# Patient Record
Sex: Female | Born: 1965 | Race: White | Hispanic: No | Smoking: Current every day smoker
Health system: Southern US, Community
[De-identification: ages and names within clinical notes are randomized; demographics above are authoritative.]

## PROBLEM LIST (undated history)

## (undated) ENCOUNTER — Emergency Department: Discharge status: Absent without leave | Payer: Medicaid Other

## (undated) DIAGNOSIS — M545 Low back pain, unspecified: Secondary | ICD-10-CM

## (undated) DIAGNOSIS — E559 Vitamin D deficiency, unspecified: Secondary | ICD-10-CM

## (undated) DIAGNOSIS — E7800 Pure hypercholesterolemia, unspecified: Secondary | ICD-10-CM

## (undated) DIAGNOSIS — I10 Essential (primary) hypertension: Secondary | ICD-10-CM

## (undated) DIAGNOSIS — K219 Gastro-esophageal reflux disease without esophagitis: Secondary | ICD-10-CM

## (undated) DIAGNOSIS — I219 Acute myocardial infarction, unspecified: Secondary | ICD-10-CM

## (undated) DIAGNOSIS — R232 Flushing: Secondary | ICD-10-CM

## (undated) DIAGNOSIS — N200 Calculus of kidney: Secondary | ICD-10-CM

## (undated) DIAGNOSIS — R9431 Abnormal electrocardiogram [ECG] [EKG]: Secondary | ICD-10-CM

## (undated) DIAGNOSIS — M069 Rheumatoid arthritis, unspecified: Secondary | ICD-10-CM

## (undated) HISTORY — PX: CARDIAC SURGERY: SHX584

## (undated) HISTORY — DX: Calculus of kidney: N20.0

## (undated) HISTORY — DX: Flushing: R23.2

## (undated) HISTORY — DX: Abnormal electrocardiogram (ECG) (EKG): R94.31

## (undated) HISTORY — DX: Low back pain, unspecified: M54.50

## (undated) HISTORY — DX: Low back pain: M54.5

## (undated) HISTORY — DX: Rheumatoid arthritis, unspecified: M06.9

## (undated) HISTORY — PX: TONSILLECTOMY: SUR1361

## (undated) HISTORY — PX: TUBAL LIGATION: SHX77

---

## 2005-05-22 ENCOUNTER — Emergency Department: Payer: Self-pay | Admitting: General Practice

## 2005-10-23 ENCOUNTER — Emergency Department: Payer: Self-pay | Admitting: Emergency Medicine

## 2005-10-23 ENCOUNTER — Other Ambulatory Visit: Payer: Self-pay

## 2006-03-25 ENCOUNTER — Emergency Department: Payer: Self-pay | Admitting: Emergency Medicine

## 2006-06-18 ENCOUNTER — Emergency Department: Payer: Self-pay | Admitting: General Practice

## 2006-12-05 ENCOUNTER — Emergency Department: Payer: Self-pay | Admitting: Emergency Medicine

## 2006-12-31 ENCOUNTER — Emergency Department: Payer: Self-pay | Admitting: Emergency Medicine

## 2007-01-07 ENCOUNTER — Emergency Department: Payer: Self-pay | Admitting: Emergency Medicine

## 2008-12-08 ENCOUNTER — Ambulatory Visit: Payer: Self-pay | Admitting: Family Medicine

## 2009-01-25 ENCOUNTER — Emergency Department: Payer: Self-pay | Admitting: Emergency Medicine

## 2009-01-31 ENCOUNTER — Emergency Department: Payer: Self-pay | Admitting: Unknown Physician Specialty

## 2009-08-17 ENCOUNTER — Emergency Department: Payer: Self-pay | Admitting: Emergency Medicine

## 2009-09-27 ENCOUNTER — Emergency Department: Payer: Self-pay | Admitting: Emergency Medicine

## 2009-11-16 ENCOUNTER — Ambulatory Visit: Payer: Self-pay | Admitting: Nurse Practitioner

## 2010-01-26 ENCOUNTER — Inpatient Hospital Stay: Payer: Self-pay | Admitting: Internal Medicine

## 2010-02-21 ENCOUNTER — Emergency Department: Payer: Self-pay | Admitting: Emergency Medicine

## 2010-11-29 ENCOUNTER — Emergency Department: Payer: Self-pay | Admitting: Emergency Medicine

## 2011-07-10 ENCOUNTER — Emergency Department: Payer: Self-pay | Admitting: *Deleted

## 2011-07-10 LAB — COMPREHENSIVE METABOLIC PANEL
Alkaline Phosphatase: 111 U/L (ref 50–136)
Anion Gap: 10 (ref 7–16)
BUN: 12 mg/dL (ref 7–18)
Bilirubin,Total: 0.4 mg/dL (ref 0.2–1.0)
Calcium, Total: 9 mg/dL (ref 8.5–10.1)
Co2: 24 mmol/L (ref 21–32)
Creatinine: 0.66 mg/dL (ref 0.60–1.30)
Potassium: 3.9 mmol/L (ref 3.5–5.1)
SGOT(AST): 64 U/L — ABNORMAL HIGH (ref 15–37)
SGPT (ALT): 86 U/L — ABNORMAL HIGH
Sodium: 140 mmol/L (ref 136–145)
Total Protein: 7.6 g/dL (ref 6.4–8.2)

## 2011-07-10 LAB — CBC
HCT: 41.1 % (ref 35.0–47.0)
HGB: 13.6 g/dL (ref 12.0–16.0)
MCH: 31.1 pg (ref 26.0–34.0)
Platelet: 220 10*3/uL (ref 150–440)
RBC: 4.36 10*6/uL (ref 3.80–5.20)
RDW: 12.3 % (ref 11.5–14.5)
WBC: 9.6 10*3/uL (ref 3.6–11.0)

## 2011-08-26 ENCOUNTER — Emergency Department: Payer: Self-pay | Admitting: *Deleted

## 2011-12-05 DIAGNOSIS — M7989 Other specified soft tissue disorders: Secondary | ICD-10-CM | POA: Insufficient documentation

## 2012-02-17 ENCOUNTER — Emergency Department: Payer: Self-pay | Admitting: Emergency Medicine

## 2012-11-17 ENCOUNTER — Emergency Department: Payer: Self-pay | Admitting: Emergency Medicine

## 2012-11-17 LAB — CBC
HCT: 44.8 % (ref 35.0–47.0)
HGB: 15.5 g/dL (ref 12.0–16.0)
MCH: 31.9 pg (ref 26.0–34.0)
MCHC: 34.6 g/dL (ref 32.0–36.0)
MCV: 92 fL (ref 80–100)
RBC: 4.86 10*6/uL (ref 3.80–5.20)
WBC: 11.9 10*3/uL — ABNORMAL HIGH (ref 3.6–11.0)

## 2012-11-17 LAB — COMPREHENSIVE METABOLIC PANEL
Albumin: 3.8 g/dL (ref 3.4–5.0)
Alkaline Phosphatase: 114 U/L (ref 50–136)
BUN: 19 mg/dL — ABNORMAL HIGH (ref 7–18)
Bilirubin,Total: 0.3 mg/dL (ref 0.2–1.0)
Co2: 28 mmol/L (ref 21–32)
EGFR (Non-African Amer.): >60
Glucose: 112 mg/dL — ABNORMAL HIGH (ref 65–99)
SGOT(AST): 25 U/L (ref 15–37)
SGPT (ALT): 41 U/L (ref 12–78)
Sodium: 139 mmol/L (ref 136–145)
Total Protein: 7.7 g/dL (ref 6.4–8.2)

## 2012-11-17 LAB — URINALYSIS, COMPLETE
Bacteria: NONE SEEN
Ketone: NEGATIVE
Leukocyte Esterase: NEGATIVE
Nitrite: NEGATIVE
Ph: 5 (ref 4.5–8.0)
Protein: NEGATIVE
RBC,UR: 1 /HPF (ref 0–5)
Specific Gravity: 1.026 (ref 1.003–1.030)
Squamous Epithelial: 1

## 2013-04-09 ENCOUNTER — Emergency Department: Payer: Self-pay | Admitting: Emergency Medicine

## 2013-06-25 LAB — BASIC METABOLIC PANEL
Anion Gap: 8 (ref 7–16)
BUN: 14 mg/dL (ref 7–18)
CALCIUM: 9.2 mg/dL (ref 8.5–10.1)
Chloride: 106 mmol/L (ref 98–107)
Co2: 26 mmol/L (ref 21–32)
Creatinine: 0.49 mg/dL — ABNORMAL LOW (ref 0.60–1.30)
EGFR (African American): >60
EGFR (Non-African Amer.): >60
GLUCOSE: 132 mg/dL — AB (ref 65–99)
Osmolality: 282 (ref 275–301)
Potassium: 3.5 mmol/L (ref 3.5–5.1)
Sodium: 140 mmol/L (ref 136–145)

## 2013-06-25 LAB — CBC
HCT: 44.3 % (ref 35.0–47.0)
HGB: 15.2 g/dL (ref 12.0–16.0)
MCH: 32 pg (ref 26.0–34.0)
MCHC: 34.3 g/dL (ref 32.0–36.0)
MCV: 93 fL (ref 80–100)
Platelet: 250 10*3/uL (ref 150–440)
RBC: 4.74 10*6/uL (ref 3.80–5.20)
RDW: 12.9 % (ref 11.5–14.5)
WBC: 11.5 10*3/uL — AB (ref 3.6–11.0)

## 2013-06-25 LAB — TROPONIN I: Troponin-I: <0.02 ng/mL

## 2013-06-25 LAB — PRO B NATRIURETIC PEPTIDE: B-Type Natriuretic Peptide: 9 pg/mL (ref 0–125)

## 2013-06-26 ENCOUNTER — Observation Stay: Payer: Self-pay | Admitting: Internal Medicine

## 2013-06-26 LAB — CK-MB: CK-MB: 0.5 ng/mL (ref 0.5–3.6)

## 2013-06-26 LAB — TROPONIN I
Troponin-I: <0.02 ng/mL
Troponin-I: <0.02 ng/mL

## 2013-06-26 LAB — CK
CK, Total: 64 U/L
CK, Total: 58 U/L

## 2013-12-08 ENCOUNTER — Emergency Department: Payer: Self-pay | Admitting: Emergency Medicine

## 2013-12-25 ENCOUNTER — Emergency Department: Payer: Self-pay | Admitting: Emergency Medicine

## 2013-12-25 LAB — URINALYSIS, COMPLETE
Bacteria: NONE SEEN
Bilirubin,UR: NEGATIVE
Blood: NEGATIVE
GLUCOSE, UR: NEGATIVE mg/dL (ref 0–75)
Hyaline Cast: 2
Ketone: NEGATIVE
LEUKOCYTE ESTERASE: NEGATIVE
Nitrite: NEGATIVE
PH: 5 (ref 4.5–8.0)
Protein: NEGATIVE
RBC,UR: 2 /HPF (ref 0–5)
Specific Gravity: 1.03 (ref 1.003–1.030)
Squamous Epithelial: 1

## 2013-12-25 LAB — COMPREHENSIVE METABOLIC PANEL
ALK PHOS: 93 U/L
ANION GAP: 9 (ref 7–16)
AST: 29 U/L (ref 15–37)
Albumin: 3.8 g/dL (ref 3.4–5.0)
BILIRUBIN TOTAL: 0.3 mg/dL (ref 0.2–1.0)
BUN: 15 mg/dL (ref 7–18)
CALCIUM: 9.5 mg/dL (ref 8.5–10.1)
Chloride: 105 mmol/L (ref 98–107)
Co2: 27 mmol/L (ref 21–32)
Creatinine: 0.57 mg/dL — ABNORMAL LOW (ref 0.60–1.30)
EGFR (African American): >60
EGFR (Non-African Amer.): >60
Glucose: 141 mg/dL — ABNORMAL HIGH (ref 65–99)
Osmolality: 284 (ref 275–301)
POTASSIUM: 4.1 mmol/L (ref 3.5–5.1)
SGPT (ALT): 34 U/L
Sodium: 141 mmol/L (ref 136–145)
Total Protein: 7.9 g/dL (ref 6.4–8.2)

## 2013-12-25 LAB — CBC WITH DIFFERENTIAL/PLATELET
Basophil #: 0.2 10*3/uL — ABNORMAL HIGH (ref 0.0–0.1)
Basophil %: 1.8 %
Eosinophil #: 0.2 10*3/uL (ref 0.0–0.7)
Eosinophil %: 2.2 %
HCT: 44.4 % (ref 35.0–47.0)
HGB: 15.3 g/dL (ref 12.0–16.0)
LYMPHS PCT: 37 %
Lymphocyte #: 3.4 10*3/uL (ref 1.0–3.6)
MCH: 32.7 pg (ref 26.0–34.0)
MCHC: 34.4 g/dL (ref 32.0–36.0)
MCV: 95 fL (ref 80–100)
MONO ABS: 0.6 x10 3/mm (ref 0.2–0.9)
Monocyte %: 6.4 %
Neutrophil #: 4.8 10*3/uL (ref 1.4–6.5)
Neutrophil %: 52.6 %
Platelet: 268 10*3/uL (ref 150–440)
RBC: 4.67 10*6/uL (ref 3.80–5.20)
RDW: 13.2 % (ref 11.5–14.5)
WBC: 9.1 10*3/uL (ref 3.6–11.0)

## 2013-12-25 LAB — TROPONIN I: Troponin-I: <0.02 ng/mL

## 2013-12-25 LAB — LIPASE, BLOOD: Lipase: 127 U/L (ref 73–393)

## 2014-02-04 ENCOUNTER — Emergency Department: Payer: Self-pay | Admitting: Student

## 2014-03-18 ENCOUNTER — Ambulatory Visit: Payer: Self-pay | Admitting: Family Medicine

## 2014-04-10 ENCOUNTER — Ambulatory Visit: Payer: Self-pay | Admitting: Specialist

## 2014-05-13 DIAGNOSIS — Z1211 Encounter for screening for malignant neoplasm of colon: Secondary | ICD-10-CM | POA: Insufficient documentation

## 2014-05-13 DIAGNOSIS — M25569 Pain in unspecified knee: Secondary | ICD-10-CM | POA: Insufficient documentation

## 2014-05-14 ENCOUNTER — Ambulatory Visit: Payer: Self-pay | Admitting: Emergency Medicine

## 2014-05-19 ENCOUNTER — Ambulatory Visit: Payer: Self-pay | Admitting: Emergency Medicine

## 2014-05-20 ENCOUNTER — Ambulatory Visit
Admit: 2014-05-20 | Disposition: A | Discharge status: Home / Self Care | Payer: Self-pay | Attending: Family Medicine | Admitting: Family Medicine

## 2014-05-20 LAB — RAPID INFLUENZA A&B ANTIGENS

## 2014-05-21 ENCOUNTER — Ambulatory Visit
Admit: 2014-05-21 | Disposition: A | Discharge status: Home / Self Care | Payer: Self-pay | Attending: Emergency Medicine | Admitting: Emergency Medicine

## 2014-05-22 ENCOUNTER — Ambulatory Visit
Admit: 2014-05-22 | Disposition: A | Discharge status: Home / Self Care | Payer: Self-pay | Attending: Family Medicine | Admitting: Family Medicine

## 2014-06-13 NOTE — Discharge Summary (Signed)
PATIENT NAME:  Nicole Ayala, HANLINE MR#:  [id number] DATE OF BIRTH:  11-19-1965  DATE OF ADMISSION:  06/26/2013 DATE OF DISCHARGE:  06/26/2013  DISCHARGE DIAGNOSES:  1. Chest pain likely noncardiac with negative serial cardiac enzymes, negative Myoview, could be stress related.  2. Tobacco abuse, counseled for 10 minutes. Agreeable to quit. Prescribed nicotine patch as per request.   SECONDARY DIAGNOSES:  1. Hypertension.  2. Anxiety.  3. Hyperlipidemia.  4. Gastroesophageal reflux disease.  5. Depression.   CONSULTATIONS: None.   PROCEDURES AND RADIOLOGY: Chest x-ray on May 6th showed no acute cardiopulmonary disease.   Myoview on May 7th showed no significant wall motion abnormality.  EF of 65%.  A 2-D echocardiogram on May 7th showed EF of more than 75%. Impaired relaxation pattern of LV diastolic filling.   HISTORY AND SHORT HOSPITAL COURSE: The patient is a 49 year old female with the above-mentioned medical problems who was admitted for chest pain. Please see Dr. Deanna Artis Gouru's dictated history and physical for further details. The patient was ruled out with 3 negative sets of troponin. She also underwent Myoview, which was negative. Also, echocardiogram which was within normal limits. Her chest pain was resolved and was discharged home in stable condition on May 7th. On the date of discharge, her vital signs are as follows: Temperature 97.7, heart rate 66 per minute, respirations 16 per minute, blood pressure 138/87 mm hg,  saturating 97% on room air.    PERTINENT PHYSICAL EXAMINATION ON THE DATE OF DISCHARGE: CARDIOVASCULAR: S1, S2 normal. No murmurs, rubs, or gallops.  LUNGS: Clear to auscultation bilaterally. No wheezing, rales, rhonchi, or crepitation.  ABDOMEN: Soft, benign.  NEUROLOGIC: Nonfocal examination. All other physical examination remained at baseline.   DISCHARGE MEDICATIONS:  1. Clonazepam 1 mg p.o. b.i.d.  2. Coreg 12.5 mg p.o. b.i.d.  3. HCTZ lisinopril 12.5/10 mg 1  tablet p.o. daily.  4. Atorvastatin 80 mg p.o. at bedtime.  5. Habitrol 21 mg patch transdermal daily.   DISCHARGE DIET: Low sodium, low fat, low cholesterol.   DISCHARGE ACTIVITY: As tolerated.   DISCHARGE INSTRUCTIONS AND FOLLOWUP: The patient was instructed to follow up with her primary care physician, Dr. Darreld Ayala, in 1-2 weeks.   TOTAL TIME DISCHARGING THIS PATIENT: 45 minutes.   ____________________________ Ellamae Sia. Sherryll Burger, MD vss:dd D: 06/29/2013 17:55:49 ET T: 06/30/2013 05:26:34 ET JOB#: [id number]  cc: Jahne Krukowski S. Sherryll Burger, MD, <Dictator> Leanna Sato, MD Ellamae Sia [facility] MD ELECTRONICALLY SIGNED 06/30/2013 10:55

## 2014-06-13 NOTE — H&P (Signed)
PATIENT NAME:  Nicole Ayala, KELTNER MR#:  [id number] DATE OF BIRTH:  February 12, 1966  DATE OF ADMISSION:  06/26/2013  PRIMARY CARE PHYSICIAN: Dr. Darreld Mclean  REFERRING PHYSICIAN:  Dr. Darnelle Catalan   CHIEF COMPLAINT: Chest pain.   HISTORY OF PRESENT ILLNESS: The patient is a 49 year old female with a past medical history of hypertension, anxiety, nicotine dependence, hyperlipidemia, GERD, and depression is presenting to the ER with a chief complaint of intermittent episodes of chest pain for the past week. The patient is reporting that she was having left-sided chest pain associated with nausea and shortness of breath. Denies any dizziness or loss of consciousness.  The chest pain is not radiating anywhere. As she was having intermittent episodes of chest pain, which is not going away, she went to see her primary care physician, Dr. Darreld Mclean, who started her on statin and anxiety medicine, Celexa.  Though patient is taking this medications, she is still having intermittent episodes of chest pressure which made her come to the hospital. Last night, her chest pain was associated with shortness of breath. The patient did not try any over-the-counter medications or pain medications. She still continues to smoke 1 pack a day. The patient was admitted to the hospital, [facility] in December 2011 with similar complaint of chest pain and had Lexiscan stress test done, which was normal at that time. The patient denies having any repeat stress test done in the past after 2011. No other complaints. During my examination, her chest pain is resolved and resting comfortably. No family members at bedside.  Denies any nausea, vomiting, abdominal pain. Denies any diarrhea, constipation. No other complaints.   PAST MEDICAL HISTORY: Hypertension, anxiety, noncompliance with medications, nicotine dependence, hyperlipidemia, GERD, depression.   PAST SURGICAL HISTORY: Tubal ligation, tonsillectomy,  adenoidectomy.  ALLERGIES: CODEINE GIVES HER RASH.   PSYCHOSOCIAL HISTORY:  Lives with grandson.  Smokes 1 pack a day. Has been smoking for more than 30 years. Denies alcohol or illicit drug usage.   FAMILY HISTORY: Hypertension runs in her family. Mom has history of congestive heart failure.   HOME MEDICATIONS: Hydrochlorothiazide lisinopril 12.5/10 1 tablet p.o. once daily, clonazepam 1 mg p.o. 2 times a day, Celexa dose unknown, Coreg 12.5 mg 2 times a day, atorvastatin 80 mg once daily, Tessalon Perles 1-2 every  8 hours as needed for cough.   REVIEW OF SYSTEMS:  CONSTITUTIONAL: Denies any fever or fatigue. Denies any weight loss or weight gain.  EYES: Denies any blurry vision, double vision, glaucoma, cataracts.  EARS, NOSE, AND THROAT: Denies epistaxis, discharge, tinnitus, snoring. RESPIRATORY:  Denies cough, COPD.  CARDIOVASCULAR: Denies any chest pain during my examination. No palpitations, syncope.  GASTROINTESTINAL: Denies nausea, vomiting, diarrhea, abdominal pain.  GENITOURINARY: No dysuria or hematuria.  GYNECOLOGIC AND BREASTS: Denies breast mass or vaginal discharge.  ENDOCRINE: Denies polyuria, nocturia, thyroid problems. HEMATOLOGY:  Denies any easy bruising, bleeding.  INTEGUMENTARY: No acne, rash, lesions.  MUSCULOSKELETAL: No joint pain in the neck and back. Denies any shoulder pain. Denies gout.  NEUROLOGIC: Denies vertigo, ataxia.  PSYCHIATRIC: Has anxiety, depression. Denies insomnia, ADD, OCD.   PHYSICAL EXAMINATION:  VITAL SIGNS: Temperature 97.5, pulse 70, respirations 16, blood pressure 126/74, pulse oximetry 96% on room air.  GENERAL APPEARANCE: Not in acute distress. Moderately built and nourished. HEENT: Normocephalic, atraumatic. Pupils are equally reacting to light and accommodation. No scleral icterus. No conjunctival injection. No sinus tenderness. No postnasal drip. Moist mucous membranes.  NECK: Supple. No JVD. No thyromegaly.  Range of motion is  intact.  LUNGS: Clear to auscultation bilaterally. No accessory muscle use and no anterior chest wall tenderness on palpation.  CARDIAC: S1, S2 normal. Regular rate and rhythm.  No murmurs. GASTROINTESTINAL: Soft.  The bowel sounds are positive in all 4 quadrants. Nontender, nondistended. No hepatosplenomegaly. No masses. NEUROLOGICAL:  Awake, alert, oriented x 3. Cranial nerves II through XII are grossly intact. Motor and sensory are intact. Reflexes are 2+.  EXTREMITIES: No edema. No cyanosis. No clubbing.  SKIN: Warm to touch. Normal turgor. No rashes. No lesions.  MUSCULOSKELETAL: No joint effusion, tenderness, erythema.  PSYCHIATRIC: Normal mood and affect.  LABORATORY AND IMAGING STUDIES: Troponin less than 0.02. CBC: WBC is elevated at 11.5.  Rest of the CBC is normal. Chem-8: Glucose 132, BUN 14, creatinine 0.49.  Rest of the Chem-8 is normal. BMP is at 9. Chest x-ray, PA and lateral views, no active cardiopulmonary disease.  Twelve lead EKG: Normal sinus rhythm at 79 beats per minute, normal PR and QRS interval. No acute ST-T wave changes.   ASSESSMENT AND PLAN: A 48 year old female presenting to the ER with a chief complaint of 1 week history of intermittent episodes of chest pain associated with shortness of breath and nausea will be admitted with the following assessment and plan.  1. Unstable angina. Admit her to telemetry, ACS protocol with oxygen, nitroglycerin, aspirin, beta blocker and statin.  We will hold a.m. dose of beta blocker as patient is scheduled for Myoview stress test in the a.m. We will obtain 2-D echocardiogram. Myoview test in the a.m.  2. Hypertension. We will resume her home medication, hydrochlorothiazide lisinopril and also Coreg.  Up titrate her medication as needed basis. At this time, blood pressure is stable.  3. Hyperlipidemia. Continue statin.  4. Gastroesophageal reflux disease.  We will provide gastrointestinal prophylaxis.  5. Anxiety and depression. The  patient denies any suicidal or homicidal ideation at this time. We will resume her home medication, Celexa, after knowing the dose, which is unknown at this time.  6. Nicotine dependence. The patient was counseled to quit smoking. We will provide her nicotine patch after ruling out acute myocardial infarction.  7. We will provide gastrointestinal and deep vein thrombosis prophylaxis.   CODE STATUS: She is full code. Son is the medical power of attorney.   Diagnosis and plan of care was discussed in detail with the patient. She is aware of the plan.   TOTAL TIME SPENT ON ADMISSION:  50 minutes.    ____________________________ Ramonita Lab, MD ag:dd/am D: 06/26/2013 02:01:38 ET T: 06/26/2013 03:16:07 ET JOB#: [id number]  cc: Ramonita Lab, MD, <Dictator> Leanna Sato, MD  Ramonita Lab MD ELECTRONICALLY SIGNED 06/29/2013 4:37

## 2014-06-26 ENCOUNTER — Encounter: Admission: RE | Payer: Self-pay | Source: Ambulatory Visit

## 2014-06-26 ENCOUNTER — Ambulatory Visit: Admission: RE | Admit: 2014-06-26 | Payer: Self-pay | Source: Ambulatory Visit | Admitting: Gastroenterology

## 2014-06-26 SURGERY — COLONOSCOPY
Anesthesia: General

## 2014-09-12 ENCOUNTER — Encounter: Payer: Self-pay | Admitting: Emergency Medicine

## 2014-09-12 ENCOUNTER — Emergency Department
Admission: EM | Admit: 2014-09-12 | Discharge: 2014-09-12 | Disposition: A | Discharge status: Home / Self Care | Payer: Medicaid Other | Attending: Emergency Medicine | Admitting: Emergency Medicine

## 2014-09-12 DIAGNOSIS — Y9389 Activity, other specified: Secondary | ICD-10-CM | POA: Diagnosis not present

## 2014-09-12 DIAGNOSIS — I10 Essential (primary) hypertension: Secondary | ICD-10-CM | POA: Diagnosis not present

## 2014-09-12 DIAGNOSIS — Z720 Tobacco use: Secondary | ICD-10-CM | POA: Insufficient documentation

## 2014-09-12 DIAGNOSIS — Y998 Other external cause status: Secondary | ICD-10-CM | POA: Diagnosis not present

## 2014-09-12 DIAGNOSIS — L089 Local infection of the skin and subcutaneous tissue, unspecified: Secondary | ICD-10-CM | POA: Insufficient documentation

## 2014-09-12 DIAGNOSIS — S20162A Insect bite (nonvenomous) of breast, left breast, initial encounter: Secondary | ICD-10-CM | POA: Diagnosis present

## 2014-09-12 DIAGNOSIS — W57XXXA Bitten or stung by nonvenomous insect and other nonvenomous arthropods, initial encounter: Secondary | ICD-10-CM | POA: Insufficient documentation

## 2014-09-12 DIAGNOSIS — B9689 Other specified bacterial agents as the cause of diseases classified elsewhere: Secondary | ICD-10-CM

## 2014-09-12 DIAGNOSIS — Y9289 Other specified places as the place of occurrence of the external cause: Secondary | ICD-10-CM | POA: Insufficient documentation

## 2014-09-12 HISTORY — DX: Essential (primary) hypertension: I10

## 2014-09-12 HISTORY — DX: Vitamin D deficiency, unspecified: E55.9

## 2014-09-12 HISTORY — DX: Pure hypercholesterolemia, unspecified: E78.00

## 2014-09-12 MED ORDER — SULFAMETHOXAZOLE-TRIMETHOPRIM 800-160 MG PO TABS: 1.0000 | ORAL_TABLET | Freq: Two times a day (BID) | ORAL | Status: DC

## 2014-09-12 MED ORDER — SULFAMETHOXAZOLE-TRIMETHOPRIM 800-160 MG PO TABS
1.0000 | ORAL_TABLET | Freq: Once | ORAL | Status: AC
  Administered 2014-09-12: 1 via ORAL
  Filled 2014-09-12: qty 1

## 2014-09-12 MED ORDER — TRAMADOL HCL 50 MG PO TABS
50.0000 mg | ORAL_TABLET | Freq: Once | ORAL | Status: AC
Start: 2014-09-12 — End: 2014-09-12
  Administered 2014-09-12: 50 mg via ORAL
  Filled 2014-09-12: qty 1

## 2014-09-12 MED ORDER — IBUPROFEN 800 MG PO TABS
800.0000 mg | ORAL_TABLET | Freq: Once | ORAL | Status: AC
  Administered 2014-09-12: 800 mg via ORAL
  Filled 2014-09-12: qty 1

## 2014-09-12 MED ORDER — TRAMADOL HCL 50 MG PO TABS: 50.0000 mg | ORAL_TABLET | Freq: Four times a day (QID) | ORAL | Status: DC | PRN

## 2014-09-12 MED ORDER — IBUPROFEN 800 MG PO TABS: 800.0000 mg | ORAL_TABLET | Freq: Three times a day (TID) | ORAL | Status: DC | PRN

## 2014-09-12 NOTE — ED Provider Notes (Signed)
[facility] Emergency Department Provider Note  ____________________________________________  Time seen: Approximately 10:37 PM  I have reviewed the triage vital signs and the nursing notes.   HISTORY  Chief Complaint Insect Bite    HPI Nicole Ayala is a 49 y.o. female patient complain of edema and erythema to the left breast. Patient states she's been doing a lot of yard work help both in the day and received multiple chigger bites earlier this week. Patient state the left breast is now erythematous and edematous and very tender to palpation. He denies any fever with this she denies any other swelling. Patient rating the pain as 8/10. No palliative measures taken for this complaint. Patient denies any discharge from the breast or nipple area.   Past Medical History  Diagnosis Date  . Hypertension   . Hypercholesteremia   . Vitamin D deficiency     There are no active problems to display for this patient.   Past Surgical History  Procedure Laterality Date  . Tubal ligation    . Tonsillectomy      Current Outpatient Rx  Name  Route  Sig  Dispense  Refill  . ibuprofen (ADVIL,MOTRIN) 800 MG tablet   Oral   Take 1 tablet (800 mg total) by mouth every 8 (eight) hours as needed for moderate pain.   15 tablet   0   . sulfamethoxazole-trimethoprim (BACTRIM DS,SEPTRA DS) 800-160 MG per tablet   Oral   Take 1 tablet by mouth 2 (two) times daily.   20 tablet   0   . traMADol (ULTRAM) 50 MG tablet   Oral   Take 1 tablet (50 mg total) by mouth every 6 (six) hours as needed.   20 tablet   0     Allergies Review of patient's allergies indicates no known allergies.  History reviewed. No pertinent family history.  Social History History  Substance Use Topics  . Smoking status: Current Every Day Smoker  . Smokeless tobacco: Not on file  . Alcohol Use: No    Review of Systems Constitutional: No fever/chills Eyes: No visual changes. ENT: No  sore throat. Cardiovascular: Denies chest pain. Respiratory: Denies shortness of breath. Gastrointestinal: No abdominal pain.  No nausea, no vomiting.  No diarrhea.  No constipation. Genitourinary: Negative for dysuria. Musculoskeletal: Negative for back pain. Skin: Erythema and edema to the left lateral breast.   Neurological: Negative for headaches, focal weakness or numbness. Endocrine:Hypertension and hyperlipidemia. 10-point ROS otherwise negative.  ____________________________________________   PHYSICAL EXAM:  VITAL SIGNS: ED Triage Vitals  Enc Vitals Group     BP 09/12/14 2133 157/83 mmHg     Pulse Rate 09/12/14 2133 73     Resp 09/12/14 2133 20     Temp 09/12/14 2133 98.3 F (36.8 C)     Temp Source 09/12/14 2133 Oral     SpO2 09/12/14 2133 95 %     Weight 09/12/14 2133 172 lb (78.019 kg)     Height 09/12/14 2133 4\' 11"  (1.499 m)     Head Cir --      Peak Flow --      Pain Score 09/12/14 2133 8     Pain Loc --      Pain Edu? --      Excl. in GC? --     Constitutional: Alert and oriented. Well appearing and in no acute distress. Eyes: Conjunctivae are normal. PERRL. EOMI. Head: Atraumatic. Nose: No congestion/rhinnorhea. Mouth/Throat: Mucous membranes are  moist.  Oropharynx non-erythematous. Neck: No stridor.  No cervical spine tenderness to palpation. Hematological/Lymphatic/Immunilogical: No cervical lymphadenopathy. Cardiovascular: Normal rate, regular rhythm. Grossly normal heart sounds.  Good peripheral circulation. Respiratory: Normal respiratory effort.  No retractions. Lungs CTAB. Gastrointestinal: Soft and nontender. No distention. No abdominal bruits. No CVA tenderness. Musculoskeletal: No lower extremity tenderness nor edema.  No joint effusions. Neurologic:  Normal speech and language. No gross focal neurologic deficits are appreciated. No gait instability. Skin:  He edema erythema to the left lateral breast area. There is no nipple discharge. Area  is nonfluctuant. Psychiatric: Mood and affect are normal. Speech and behavior are normal.  ____________________________________________   LABS (all labs ordered are listed, but only abnormal results are displayed)  Labs Reviewed - No data to display ____________________________________________  EKG   ____________________________________________  RADIOLOGY   ____________________________________________   PROCEDURES  Procedure(s) performed: None  Critical Care performed: No  ____________________________________________   INITIAL IMPRESSION / ASSESSMENT AND PLAN / ED COURSE  Pertinent labs & imaging results that were available during my care of the patient were reviewed by me and considered in my medical decision making (see chart for details).   infection to the left breast secondary to insect bites. Patient given advice on home care. Patient given Bactrim DS, tramadol, and ibuprofen. Patient given a prescription for same medications of filling the morning. Patient advised follow-up family doctor in 2-3 days return back to ER for condition worsens. ____________________________________________   FINAL CLINICAL IMPRESSION(S) / ED DIAGNOSES  Final diagnoses:  Localized bacterial infection of skin      Joni Reining, PA-C 09/12/14 2251  Sharyn Creamer, MD 09/12/14 [phone]

## 2014-09-12 NOTE — ED Notes (Signed)
AAOx3.  Skin warm and dry.  D/c home.  NAD.

## 2014-09-12 NOTE — ED Notes (Signed)
Pt presents to ER alert and in NAd. P tstates "chigger" bites to left breast for several days.

## 2014-09-21 ENCOUNTER — Encounter: Payer: Self-pay | Admitting: Emergency Medicine

## 2014-09-21 ENCOUNTER — Emergency Department
Admission: EM | Admit: 2014-09-21 | Discharge: 2014-09-21 | Disposition: A | Discharge status: Home / Self Care | Payer: Medicaid Other | Attending: Emergency Medicine | Admitting: Emergency Medicine

## 2014-09-21 DIAGNOSIS — Z792 Long term (current) use of antibiotics: Secondary | ICD-10-CM | POA: Diagnosis not present

## 2014-09-21 DIAGNOSIS — Z720 Tobacco use: Secondary | ICD-10-CM | POA: Diagnosis not present

## 2014-09-21 DIAGNOSIS — M549 Dorsalgia, unspecified: Secondary | ICD-10-CM | POA: Diagnosis present

## 2014-09-21 DIAGNOSIS — F419 Anxiety disorder, unspecified: Secondary | ICD-10-CM | POA: Diagnosis not present

## 2014-09-21 DIAGNOSIS — Z79899 Other long term (current) drug therapy: Secondary | ICD-10-CM | POA: Insufficient documentation

## 2014-09-21 DIAGNOSIS — M5431 Sciatica, right side: Secondary | ICD-10-CM | POA: Insufficient documentation

## 2014-09-21 DIAGNOSIS — I10 Essential (primary) hypertension: Secondary | ICD-10-CM | POA: Insufficient documentation

## 2014-09-21 MED ORDER — DEXAMETHASONE SODIUM PHOSPHATE 10 MG/ML IJ SOLN
10.0000 mg | Freq: Once | INTRAMUSCULAR | Status: AC
  Administered 2014-09-21: 10 mg via INTRAMUSCULAR
  Filled 2014-09-21: qty 1

## 2014-09-21 MED ORDER — TRAMADOL HCL 50 MG PO TABS: 50.0000 mg | ORAL_TABLET | Freq: Four times a day (QID) | ORAL | Status: DC | PRN

## 2014-09-21 MED ORDER — ONDANSETRON 4 MG PO TBDP
4.0000 mg | ORAL_TABLET | Freq: Once | ORAL | Status: AC
  Administered 2014-09-21: 4 mg via ORAL
  Filled 2014-09-21: qty 1

## 2014-09-21 MED ORDER — MORPHINE SULFATE 4 MG/ML IJ SOLN
4.0000 mg | Freq: Once | INTRAMUSCULAR | Status: AC
  Administered 2014-09-21: 4 mg via INTRAMUSCULAR
  Filled 2014-09-21: qty 1

## 2014-09-21 NOTE — ED Notes (Signed)
Pt reports lower back pain that radiates down into right leg, pt denies injury.

## 2014-09-21 NOTE — Discharge Instructions (Signed)
Sciatica Sciatica is pain, weakness, numbness, or tingling along the path of the sciatic nerve. The nerve starts in the lower back and runs down the back of each leg. The nerve controls the muscles in the lower leg and in the back of the knee, while also providing sensation to the back of the thigh, lower leg, and the sole of your foot. Sciatica is a symptom of another medical condition. For instance, nerve damage or certain conditions, such as a herniated disk or bone spur on the spine, pinch or put pressure on the sciatic nerve. This causes the pain, weakness, or other sensations normally associated with sciatica. Generally, sciatica only affects one side of the body. CAUSES   Herniated or slipped disc.  Degenerative disk disease.  A pain disorder involving the narrow muscle in the buttocks (piriformis syndrome).  Pelvic injury or fracture.  Pregnancy.  Tumor (rare). SYMPTOMS  Symptoms can vary from mild to very severe. The symptoms usually travel from the low back to the buttocks and down the back of the leg. Symptoms can include:  Mild tingling or dull aches in the lower back, leg, or hip.  Numbness in the back of the calf or sole of the foot.  Burning sensations in the lower back, leg, or hip.  Sharp pains in the lower back, leg, or hip.  Leg weakness.  Severe back pain inhibiting movement. These symptoms may get worse with coughing, sneezing, laughing, or prolonged sitting or standing. Also, being overweight may worsen symptoms. DIAGNOSIS  Your caregiver will perform a physical exam to look for common symptoms of sciatica. He or she may ask you to do certain movements or activities that would trigger sciatic nerve pain. Other tests may be performed to find the cause of the sciatica. These may include:  Blood tests.  X-rays.  Imaging tests, such as an MRI or CT scan. TREATMENT  Treatment is directed at the cause of the sciatic pain. Sometimes, treatment is not necessary  and the pain and discomfort goes away on its own. If treatment is needed, your caregiver may suggest:  Over-the-counter medicines to relieve pain.  Prescription medicines, such as anti-inflammatory medicine, muscle relaxants, or narcotics.  Applying heat or ice to the painful area.  Steroid injections to lessen pain, irritation, and inflammation around the nerve.  Reducing activity during periods of pain.  Exercising and stretching to strengthen your abdomen and improve flexibility of your spine. Your caregiver may suggest losing weight if the extra weight makes the back pain worse.  Physical therapy.  Surgery to eliminate what is pressing or pinching the nerve, such as a bone spur or part of a herniated disk. HOME CARE INSTRUCTIONS   Only take over-the-counter or prescription medicines for pain or discomfort as directed by your caregiver.  Apply ice to the affected area for 20 minutes, 3-4 times a day for the first 48-72 hours. Then try heat in the same way.  Exercise, stretch, or perform your usual activities if these do not aggravate your pain.  Attend physical therapy sessions as directed by your caregiver.  Keep all follow-up appointments as directed by your caregiver.  Do not wear high heels or shoes that do not provide proper support.  Check your mattress to see if it is too soft. A firm mattress may lessen your pain and discomfort. SEEK IMMEDIATE MEDICAL CARE IF:   You lose control of your bowel or bladder (incontinence).  You have increasing weakness in the lower back, pelvis, buttocks,   or legs.  You have redness or swelling of your back.  You have a burning sensation when you urinate.  You have pain that gets worse when you lie down or awakens you at night.  Your pain is worse than you have experienced in the past.  Your pain is lasting longer than 4 weeks.  You are suddenly losing weight without reason. MAKE SURE YOU:  Understand these  instructions.  Will watch your condition.  Will get help right away if you are not doing well or get worse. Document Released: 01/31/2001 Document Revised: 08/08/2011 Document Reviewed: 06/18/2011 Exit[employer] Patient Information 2015 [employer]. This information is not intended to replace advice given to you by your health care provider. Make sure you discuss any questions you have with your health care provider.  

## 2014-09-21 NOTE — ED Notes (Signed)
Lower back pain which radiates into right leg  Ambulates with sl limp.  Denies injury

## 2014-09-21 NOTE — ED Provider Notes (Signed)
[facility] Emergency Department Provider Note  ____________________________________________  Time seen: On arrival  I have reviewed the triage vital signs and the nursing notes.   HISTORY  Chief Complaint Back Pain    HPI Nicole Ayala is a 49 y.o. female who presents with back pain. She reports the pain began yesterday and has been worsening. It is worse with flexion and extension the pain is sharp and radiates down the right leg. She denies weakness or numbness. No fevers no chills. No urinary difficulties. She is never had this before. She took Tylenol without relief. No fevers no chills  Past Medical History  Diagnosis Date  . Hypertension   . Hypercholesteremia   . Vitamin D deficiency     There are no active problems to display for this patient.   Past Surgical History  Procedure Laterality Date  . Tubal ligation    . Tonsillectomy      Current Outpatient Rx  Name  Route  Sig  Dispense  Refill  . lisinopril (PRINIVIL,ZESTRIL) 20 MG tablet   Oral   Take 20 mg by mouth daily.         . rosuvastatin (CRESTOR) 20 MG tablet   Oral   Take 20 mg by mouth daily.         Marland Kitchen ibuprofen (ADVIL,MOTRIN) 800 MG tablet   Oral   Take 1 tablet (800 mg total) by mouth every 8 (eight) hours as needed for moderate pain.   15 tablet   0   . sulfamethoxazole-trimethoprim (BACTRIM DS,SEPTRA DS) 800-160 MG per tablet   Oral   Take 1 tablet by mouth 2 (two) times daily.   20 tablet   0   . traMADol (ULTRAM) 50 MG tablet   Oral   Take 1 tablet (50 mg total) by mouth every 6 (six) hours as needed.   20 tablet   0     Allergies Review of patient's allergies indicates no known allergies.  No family history on file.  Social History History  Substance Use Topics  . Smoking status: Current Every Day Smoker    Types: Cigarettes  . Smokeless tobacco: Not on file  . Alcohol Use: No    Review of Systems  Constitutional: Negative for  fever. Eyes: Negative for visual changes. ENT: Negative for sore throat   Genitourinary: Negative for dysuria. Musculoskeletal: Negative for back pain. Skin: Negative for rash. Neurological: Negative for headaches or focal weakness   ____________________________________________   PHYSICAL EXAM:  VITAL SIGNS: ED Triage Vitals  Enc Vitals Group     BP 09/21/14 1243 101/67 mmHg     Pulse Rate 09/21/14 1243 81     Resp 09/21/14 1243 18     Temp 09/21/14 1243 97.9 F (36.6 C)     Temp Source 09/21/14 1243 Oral     SpO2 09/21/14 1243 99 %     Weight 09/21/14 1243 168 lb (76.204 kg)     Height 09/21/14 1243 4\' 11"  (1.499 m)     Head Cir --      Peak Flow --      Pain Score 09/21/14 1249 10     Pain Loc --      Pain Edu? --      Excl. in GC? --      Constitutional: Alert and oriented. No distress. Anxious and uncomfortable Eyes: Conjunctivae are normal.  ENT   Head: Normocephalic and atraumatic.   Mouth/Throat: Mucous membranes are moist.  Cardiovascular: Normal rate, regular rhythm.  Respiratory: Normal respiratory effort without tachypnea nor retractions.  Gastrointestinal: Soft and non-tender in all quadrants. No distention. There is no CVA tenderness. Musculoskeletal: Nontender with normal range of motion in all extremities. No muscle spasm felt, full range of motion of back but it painful with flexion especially. No vertebral tenderness to palpation Neurologic:  Normal speech and language. No gross focal neurologic deficits are appreciated. Strength is normal in the lower extremities Skin:  Skin is warm, dry and intact. No rash noted. Psychiatric: Mood and affect are normal. Patient exhibits appropriate insight and judgment.  ____________________________________________    LABS (pertinent positives/negatives)  Labs Reviewed - No data to display  ____________________________________________     ____________________________________________     RADIOLOGY I have personally reviewed any xrays that were ordered on this patient: None  ____________________________________________   PROCEDURES  Procedure(s) performed: none   ____________________________________________   INITIAL IMPRESSION / ASSESSMENT AND PLAN / ED COURSE  Pertinent labs & imaging results that were available during my care of the patient were reviewed by me and considered in my medical decision making (see chart for details).  Patient's history of present illness most consistent with sciatica. We will give Decadron IM and morphine and Zofran for pain control and reevaluate  On reevaluation patient's pain is significantly improved she is able to ambulate well. Recommend follow with her primary care physician. Return precautions discussed including weakness fevers chills.  ____________________________________________   FINAL CLINICAL IMPRESSION(S) / ED DIAGNOSES  Final diagnoses:  Sciatica, right     Jene Every, MD 09/21/14 1513

## 2014-10-26 ENCOUNTER — Encounter: Payer: Self-pay | Admitting: Emergency Medicine

## 2014-10-26 ENCOUNTER — Ambulatory Visit: Payer: Medicaid Other

## 2014-10-26 ENCOUNTER — Ambulatory Visit
Admission: EM | Admit: 2014-10-26 | Discharge: 2014-10-26 | Disposition: A | Discharge status: Home / Self Care | Payer: Medicaid Other | Attending: Family Medicine | Admitting: Family Medicine

## 2014-10-26 DIAGNOSIS — S6991XA Unspecified injury of right wrist, hand and finger(s), initial encounter: Secondary | ICD-10-CM | POA: Diagnosis not present

## 2014-10-26 DIAGNOSIS — E559 Vitamin D deficiency, unspecified: Secondary | ICD-10-CM | POA: Insufficient documentation

## 2014-10-26 DIAGNOSIS — E780 Pure hypercholesterolemia: Secondary | ICD-10-CM | POA: Diagnosis not present

## 2014-10-26 DIAGNOSIS — I10 Essential (primary) hypertension: Secondary | ICD-10-CM | POA: Insufficient documentation

## 2014-10-26 DIAGNOSIS — M67441 Ganglion, right hand: Secondary | ICD-10-CM | POA: Diagnosis not present

## 2014-10-26 DIAGNOSIS — F17210 Nicotine dependence, cigarettes, uncomplicated: Secondary | ICD-10-CM | POA: Insufficient documentation

## 2014-10-26 DIAGNOSIS — M6740 Ganglion, unspecified site: Secondary | ICD-10-CM | POA: Diagnosis not present

## 2014-10-26 DIAGNOSIS — X58XXXA Exposure to other specified factors, initial encounter: Secondary | ICD-10-CM | POA: Diagnosis not present

## 2014-10-26 DIAGNOSIS — Z79899 Other long term (current) drug therapy: Secondary | ICD-10-CM | POA: Diagnosis not present

## 2014-10-26 DIAGNOSIS — M79644 Pain in right finger(s): Secondary | ICD-10-CM

## 2014-10-26 MED ORDER — IBUPROFEN 600 MG PO TABS: 600.0000 mg | ORAL_TABLET | Freq: Three times a day (TID) | ORAL | Status: DC | PRN

## 2014-10-26 MED ORDER — TRAMADOL HCL 50 MG PO TABS: 50.0000 mg | ORAL_TABLET | Freq: Three times a day (TID) | ORAL | Status: DC | PRN

## 2014-10-26 NOTE — ED Notes (Signed)
Pt with pain in right 3th finger pain

## 2014-10-26 NOTE — ED Provider Notes (Signed)
[facility] Emergency Department Provider Note  ____________________________________________  Time seen: Approximately 3:17 PM  I have reviewed the triage vital signs and the nursing notes.   HISTORY  Chief Complaint Finger Injury   HPI Nicole Ayala is a 49 y.o. female presents for right middle finger pain x 4-5 days. States pain only to right middle finger middle joint. STates works with hands daily at work and frequently bumps knuckles but does not remember a specific injury. Denies redness, drainage, break in skin or other changes. Denies numbness, tingling sensations. Denies difficulty bending or moving finger. Reports pain is 6/10 aching to right middle finger, denies pain radiation. States pain worse with bending finger.    Past Medical History  Diagnosis Date  . Hypertension   . Hypercholesteremia   . Vitamin D deficiency     There are no active problems to display for this patient.   Past Surgical History  Procedure Laterality Date  . Tubal ligation    . Tonsillectomy    Pituitary gland tumor removed  Current Outpatient Rx  Name  Route  Sig  Dispense  Refill  . carvedilol (COREG CR) 10 MG 24 hr capsule   Oral   Take 25 mg by mouth daily.         Marland Kitchen ibuprofen (ADVIL,MOTRIN) 800 MG tablet   Oral   Take 1 tablet (800 mg total) by mouth every 8 (eight) hours as needed for moderate pain.   15 tablet   0   . lisinopril (PRINIVIL,ZESTRIL) 20 MG tablet   Oral   Take 20 mg by mouth daily.         . rosuvastatin (CRESTOR) 20 MG tablet   Oral   Take 20 mg by mouth daily.         Marland Kitchen sulfamethoxazole-trimethoprim (BACTRIM DS,SEPTRA DS) 800-160 MG per tablet   Oral   Take 1 tablet by mouth 2 (two) times daily.   20 tablet   0   . traMADol (ULTRAM) 50 MG tablet   Oral   Take 1 tablet (50 mg total) by mouth every 6 (six) hours as needed.   20 tablet   0    PCP: Marvis Moeller   Allergies Review of patient's allergies indicates no known  allergies.  History reviewed. No pertinent family history.  Social History Social History  Substance Use Topics  . Smoking status: Current Every Day Smoker    Types: Cigarettes  . Smokeless tobacco: None  . Alcohol Use: None    Review of Systems Constitutional: No fever/chills Eyes: No visual changes. ENT: No sore throat. Cardiovascular: Denies chest pain. Respiratory: Denies shortness of breath. Gastrointestinal: No abdominal pain.  No nausea, no vomiting.  No diarrhea.  No constipation. Genitourinary: Negative for dysuria. Musculoskeletal: Negative for back pain.right middle finger pain as above.  Skin: Negative for rash. Neurological: Negative for headaches, focal weakness or numbness.  10-point ROS otherwise negative.  ____________________________________________   PHYSICAL EXAM:  VITAL SIGNS: ED Triage Vitals  Enc Vitals Group     BP 10/26/14 1458 124/67 mmHg     Pulse Rate 10/26/14 1458 68     Resp 10/26/14 1458 18     Temp 10/26/14 1458 98.2 F (36.8 C)     Temp Source 10/26/14 1458 Tympanic     SpO2 10/26/14 1458 99 %     Weight 10/26/14 1458 170 lb (77.111 kg)     Height 10/26/14 1458 5' 0.5" (1.537 m)  Head Cir --      Peak Flow --      Pain Score 10/26/14 1502 10     Pain Loc --      Pain Edu? --      Excl. in GC? --     Constitutional: Alert and oriented. Well appearing and in no acute distress. Eyes: Conjunctivae are normal. PERRL. EOMI. Head: Atraumatic.  Nose: No congestion/rhinnorhea.  Mouth/Throat: Mucous membranes are moist.   Neck: No stridor.  No cervical spine tenderness to palpation. Hematological/Lymphatic/Immunilogical: No cervical lymphadenopathy. Cardiovascular: Normal rate, regular rhythm. Grossly normal heart sounds.  Good peripheral circulation. Respiratory: Normal respiratory effort.  No retractions. Lungs CTAB. Gastrointestinal: Soft and nontender. No distention. Normal Bowel sounds.  Musculoskeletal: No lower or upper  extremity tenderness nor edema.  No joint effusions. Bilateral pedal pulses equal and easily palpated.  Except: right middle phalanx dorsal DIP, small 1cm round firm area of swelling, illuminates with light when placed on, mild to mod TTP, Full ROM, no motor or tendon deficit, sensation intact, bilateral hand grips equal, distal radial pulses equal bilaterally. Skin intact. No erythema, no sings of infection. Right third finger otherwise nontender.  Neurologic:  Normal speech and language. No gross focal neurologic deficits are appreciated. No gait instability. Skin:  Skin is warm, dry and intact. No rash noted. Psychiatric: Mood and affect are normal. Speech and behavior are normal.  ____________________________________________   LABS (all labs ordered are listed, but only abnormal results are displayed)  Labs Reviewed - No data to display  RADIOLOGY EXAM: RIGHT MIDDLE FINGER 2+V  COMPARISON: No priors.  FINDINGS: Multiple views of the right third finger demonstrate no acute displaced fracture, subluxation, dislocation, or soft tissue abnormality.  IMPRESSION: No acute radiographic abnormality of the right middle finger.   Electronically Signed By: Trudie Reed M.D. On: 10/26/2014 15:33  I, Renford Dills, personally viewed and evaluated these images (plain radiographs) as part of my medical decision making.    ____________________________________________   PROCEDURES  Procedure(s) performed: Right third and fourth fingers buddy taped by RN. Neurovascular intact post buddy taping.  ____________________________________   INITIAL IMPRESSION / ASSESSMENT AND PLAN / ED COURSE  Pertinent labs & imaging results that were available during my care of the patient were reviewed by me and considered in my medical decision making (see chart for details).  Well appearing. No acute distress. Presents for right middle finger pain x 4-5 days with smooth round area of  swelling to right third PIP joint. Right third finger xray negative. Suspect ganglion cyst. Will treat conservatively with rest, ice, buddy tape. Follow up with Primary care physician or orthopedic, information given, as needed next week for continued pain. PRN ibuprofen and tramadol. Patient and family verbalized understanding and agreed to plan.   ____________________________________________   FINAL CLINICAL IMPRESSION(S) / ED DIAGNOSES  Final diagnoses:  Pain in finger of right hand  Ganglion cyst       Renford Dills, NP 10/26/14 1650

## 2014-10-26 NOTE — Discharge Instructions (Signed)
Take medication as prescribed. Apply ice. Rest. Avoid strenuous and repetitive motions as able.   Follow up with your primary care physician or orthopedic above as needed. Return to Urgent care for new or worsening concerns.   Ganglion Cyst A ganglion cyst is a noncancerous, fluid-filled lump that occurs near joints or tendons. The ganglion cyst grows out of a joint or the lining of a tendon. It most often develops in the hand or wrist but can also develop in the shoulder, elbow, hip, knee, ankle, or foot. The round or oval ganglion can be pea sized or larger than a grape. Increased activity may enlarge the size of the cyst because more fluid starts to build up.  CAUSES  It is not completely known what causes a ganglion cyst to grow. However, it may be related to:  Inflammation or irritation around the joint.  An injury.  Repetitive movements or overuse.  Arthritis. SYMPTOMS  A lump most often appears in the hand or wrist, but can occur in other areas of the body. Generally, the lump is painless without other symptoms. However, sometimes pain can be felt during activity or when pressure is applied to the lump. The lump may even be tender to the touch. Tingling, pain, numbness, or muscle weakness can occur if the ganglion cyst presses on a nerve. Your grip may be weak and you may have less movement in your joints.  DIAGNOSIS  Ganglion cysts are most often diagnosed based on a physical exam, noting where the cyst is and how it looks. Your caregiver will feel the lump and may shine a light alongside it. If it is a ganglion, a light often shines through it. Your caregiver may order an X-ray, ultrasound, or MRI to rule out other conditions. TREATMENT  Ganglions usually go away on their own without treatment. If pain or other symptoms are involved, treatment may be needed. Treatment is also needed if the ganglion limits your movement or if it gets infected. Treatment options include:  Wearing a  wrist or finger brace or splint.  Taking anti-inflammatory medicine.  Draining fluid from the lump with a needle (aspiration).  Injecting a steroid into the joint.  Surgery to remove the ganglion cyst and its stalk that is attached to the joint or tendon. However, ganglion cysts can grow back. HOME CARE INSTRUCTIONS   Do not press on the ganglion, poke it with a needle, or hit it with a heavy object. You may rub the lump gently and often. Sometimes fluid moves out of the cyst.  Only take medicines as directed by your caregiver.  Wear your brace or splint as directed by your caregiver. SEEK MEDICAL CARE IF:   Your ganglion becomes larger or more painful.  You have increased redness, red streaks, or swelling.  You have pus coming from the lump.  You have weakness or numbness in the affected area. MAKE SURE YOU:   Understand these instructions.  Will watch your condition.  Will get help right away if you are not doing well or get worse. Document Released: 02/04/2000 Document Revised: 11/01/2011 Document Reviewed: 04/02/2007 [facility] Patient Information 2015 [place], [place]. This information is not intended to replace advice given to you by your health care provider. Make sure you discuss any questions you have with your health care provider.  Musculoskeletal Pain Musculoskeletal pain is muscle and boney aches and pains. These pains can occur in any part of the body. Your caregiver may treat you without knowing the cause  of the pain. They may treat you if blood or urine tests, X-rays, and other tests were normal.  CAUSES There is often not a definite cause or reason for these pains. These pains may be caused by a type of germ (virus). The discomfort may also come from overuse. Overuse includes working out too hard when your body is not fit. Boney aches also come from weather changes. Bone is sensitive to atmospheric pressure changes. HOME CARE INSTRUCTIONS   Ask when your test  results will be ready. Make sure you get your test results.  Only take over-the-counter or prescription medicines for pain, discomfort, or fever as directed by your caregiver. If you were given medications for your condition, do not drive, operate machinery or power tools, or sign legal documents for 24 hours. Do not drink alcohol. Do not take sleeping pills or other medications that may interfere with treatment.  Continue all activities unless the activities cause more pain. When the pain lessens, slowly resume normal activities. Gradually increase the intensity and duration of the activities or exercise.  During periods of severe pain, bed rest may be helpful. Lay or sit in any position that is comfortable.  Putting ice on the injured area.  Put ice in a bag.  Place a towel between your skin and the bag.  Leave the ice on for 15 to 20 minutes, 3 to 4 times a day.  Follow up with your caregiver for continued problems and no reason can be found for the pain. If the pain becomes worse or does not go away, it may be necessary to repeat tests or do additional testing. Your caregiver may need to look further for a possible cause. SEEK IMMEDIATE MEDICAL CARE IF:  You have pain that is getting worse and is not relieved by medications.  You develop chest pain that is associated with shortness or breath, sweating, feeling sick to your stomach (nauseous), or throw up (vomit).  Your pain becomes localized to the abdomen.  You develop any new symptoms that seem different or that concern you. MAKE SURE YOU:   Understand these instructions.  Will watch your condition.  Will get help right away if you are not doing well or get worse. Document Released: 02/06/2005 Document Revised: 05/01/2011 Document Reviewed: 10/11/2012 [facility] Patient Information 2015 [place], [place]. This information is not intended to replace advice given to you by your health care provider. Make sure you discuss any  questions you have with your health care provider.

## 2014-11-10 ENCOUNTER — Other Ambulatory Visit
Admission: RE | Admit: 2014-11-10 | Discharge: 2014-11-10 | Disposition: A | Discharge status: Home / Self Care | Payer: Medicaid Other | Source: Ambulatory Visit | Attending: Specialist | Admitting: Specialist

## 2014-11-10 DIAGNOSIS — M130 Polyarthritis, unspecified: Secondary | ICD-10-CM | POA: Insufficient documentation

## 2014-11-10 LAB — SEDIMENTATION RATE: SED RATE: 9 mm/h (ref 0–30)

## 2014-11-11 LAB — C-REACTIVE PROTEIN: CRP: 0.8 mg/dL (ref <1.0)

## 2014-11-11 LAB — RHEUMATOID FACTOR: Rhuematoid fact SerPl-aCnc: 17.6 IU/mL — ABNORMAL HIGH (ref 0.0–13.9)
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
## 2015-02-09 ENCOUNTER — Ambulatory Visit
Admission: EM | Admit: 2015-02-09 | Discharge: 2015-02-09 | Disposition: A | Discharge status: Home / Self Care | Payer: Medicaid Other | Attending: Family Medicine | Admitting: Family Medicine

## 2015-02-09 DIAGNOSIS — L03031 Cellulitis of right toe: Secondary | ICD-10-CM | POA: Diagnosis not present

## 2015-02-09 HISTORY — DX: Gastro-esophageal reflux disease without esophagitis: K21.9

## 2015-02-09 MED ORDER — CEFUROXIME AXETIL 500 MG PO TABS: 500.0000 mg | ORAL_TABLET | Freq: Two times a day (BID) | ORAL | Status: DC

## 2015-02-09 NOTE — ED Provider Notes (Signed)
CSN: [id number]     Arrival date & time 02/09/15  0903 History   First MD Initiated Contact with Patient 02/09/15 1010     Chief Complaint  Patient presents with  . Toe Pain   (Consider location/radiation/quality/duration/timing/severity/associated sxs/prior Treatment) HPI   Nicole Ayala is a 49 y.o. female presenting for toe pain x2 days.  She first noticed pain, redness and swelling of lateral nailbed edge 2 days ago.  She has never had an ingrown toenail, toenail removal, or paronychia previously.  She denies any fevers, drainage.  Past Medical History  Diagnosis Date  . Hypertension   . Hypercholesteremia   . Vitamin D deficiency   . GERD (gastroesophageal reflux disease)    Past Surgical History  Procedure Laterality Date  . Tubal ligation    . Tonsillectomy     Family History  Problem Relation Age of Onset  . Heart failure Mother   . Cancer Father    Social History  Substance Use Topics  . Smoking status: Current Every Day Smoker -- 1.00 packs/day    Types: Cigarettes  . Smokeless tobacco: None  . Alcohol Use: No   OB History    No data available     Review of Systems  All other systems reviewed and are negative.   Allergies  Review of patient's allergies indicates no known allergies.  Home Medications   Prior to Admission medications   Medication Sig Start Date End Date Taking? Authorizing Provider  carvedilol (COREG CR) 10 MG 24 hr capsule Take 25 mg by mouth daily.   Yes Historical Provider, MD  diclofenac (VOLTAREN) 50 MG EC tablet Take 50 mg by mouth 2 (two) times daily.   Yes Historical Provider, MD  lisinopril (PRINIVIL,ZESTRIL) 20 MG tablet Take 20 mg by mouth daily.   Yes Historical Provider, MD  omeprazole (PRILOSEC) 40 MG capsule Take 40 mg by mouth daily.   Yes Historical Provider, MD  rosuvastatin (CRESTOR) 20 MG tablet Take 20 mg by mouth daily.   Yes Historical Provider, MD  cefUROXime (CEFTIN) 500 MG tablet Take 1 tablet (500 mg total) by  mouth 2 (two) times daily with a meal. 02/09/15   Erasmo Downer, MD  ibuprofen (ADVIL,MOTRIN) 600 MG tablet Take 1 tablet (600 mg total) by mouth every 8 (eight) hours as needed for mild pain or moderate pain. 10/26/14   Renford Dills, NP   Meds Ordered and Administered this Visit  Medications - No data to display  BP 174/79 mmHg  Pulse 58  Temp(Src) 97.7 F (36.5 C) (Tympanic)  Resp 16  Ht 4\' 11"  (1.499 m)  Wt 172 lb (78.019 kg)  BMI 34.72 kg/m2  SpO2 99% No data found.   Physical Exam  Constitutional: She is oriented to person, place, and time. She appears well-developed and well-nourished. No distress.  HENT:  Head: Normocephalic and atraumatic.  Eyes: Conjunctivae and EOM are normal.  Cardiovascular: Normal rate and intact distal pulses.   Pulmonary/Chest: Effort normal. No respiratory distress.  Musculoskeletal:  R great toe: Erythema and edema of lateral toenail fold with TTP. No drainage, no fluctuance. Callused area at superior aspect of erythematous are.  Toenail with onychomycosis No joint tenderness or erythema  Neurological: She is alert and oriented to person, place, and time.  Skin: Skin is warm and dry. No rash noted.  Psychiatric: She has a normal mood and affect. Her behavior is normal.    ED Course  Procedures (including critical care time)  Labs Review Labs Reviewed - No data to display  Imaging Review No results found.   Visual Acuity Review  Right Eye Distance:   Left Eye Distance:   Bilateral Distance:    Right Eye Near:   Left Eye Near:    Bilateral Near:         MDM   1. Paronychia of great toe of right foot    Treat with 10 day course of Ceftin 500mg  BID.  Advised warm compresses and tylenol prn for pain control.  Follow-up with PCP if worsening or not improving.  Also advised PCP follow-up for BP.  , MD, MPH PGY-2,  [facility] 02/09/2015 10:37 AM  02/11/2015, MD 02/09/15  [phone]

## 2015-02-09 NOTE — ED Provider Notes (Signed)
CSN: [id number]     Arrival date & time 02/09/15  2683 History   First MD Initiated Contact with Patient 02/09/15 1010    Nurses notes were reviewed. Chief Complaint  Patient presents with  . Toe Pain   (Consider location/radiation/quality/duration/timing/severity/associated sxs/prior Treatment) Patient is a 49 y.o. female presenting with toe pain. The history is provided by the patient. No language interpreter was used.  Toe Pain This is a new problem. Nothing aggravates the symptoms. Nothing relieves the symptoms. She has tried nothing for the symptoms.    Past Medical History  Diagnosis Date  . Hypertension   . Hypercholesteremia   . Vitamin D deficiency   . GERD (gastroesophageal reflux disease)    Past Surgical History  Procedure Laterality Date  . Tubal ligation    . Tonsillectomy     Family History  Problem Relation Age of Onset  . Heart failure Mother   . Cancer Father    Social History  Substance Use Topics  . Smoking status: Current Every Day Smoker -- 1.00 packs/day    Types: Cigarettes  . Smokeless tobacco: None  . Alcohol Use: No   OB History    No data available     Review of Systems  All other systems reviewed and are negative.   Allergies  Review of patient's allergies indicates no known allergies.  Home Medications   Prior to Admission medications   Medication Sig Start Date End Date Taking? Authorizing Provider  carvedilol (COREG CR) 10 MG 24 hr capsule Take 25 mg by mouth daily.   Yes Historical Provider, MD  diclofenac (VOLTAREN) 50 MG EC tablet Take 50 mg by mouth 2 (two) times daily.   Yes Historical Provider, MD  lisinopril (PRINIVIL,ZESTRIL) 20 MG tablet Take 20 mg by mouth daily.   Yes Historical Provider, MD  omeprazole (PRILOSEC) 40 MG capsule Take 40 mg by mouth daily.   Yes Historical Provider, MD  rosuvastatin (CRESTOR) 20 MG tablet Take 20 mg by mouth daily.   Yes Historical Provider, MD  cefUROXime (CEFTIN) 500 MG tablet Take 1  tablet (500 mg total) by mouth 2 (two) times daily with a meal. 02/09/15   Erasmo Downer, MD  ibuprofen (ADVIL,MOTRIN) 600 MG tablet Take 1 tablet (600 mg total) by mouth every 8 (eight) hours as needed for mild pain or moderate pain. 10/26/14   Renford Dills, NP   Meds Ordered and Administered this Visit  Medications - No data to display  BP 174/79 mmHg  Pulse 58  Temp(Src) 97.7 F (36.5 C) (Tympanic)  Resp 16  Ht 4\' 11"  (1.499 m)  Wt 172 lb (78.019 kg)  BMI 34.72 kg/m2  SpO2 99% No data found.   Physical Exam  Constitutional: She is oriented to person, place, and time. She appears well-developed and well-nourished.  HENT:  Head: Normocephalic and atraumatic.  Eyes: Conjunctivae are normal. Pupils are equal, round, and reactive to light.  Musculoskeletal: Normal range of motion.  Neurological: She is alert and oriented to person, place, and time.  Skin: Skin is warm. There is erythema.  Psychiatric: She has a normal mood and affect.  Vitals reviewed.   ED Course  Procedures (including critical care time)  Labs Review Labs Reviewed - No data to display  Imaging Review No results found.   Visual Acuity Review  Right Eye Distance:   Left Eye Distance:   Bilateral Distance:    Right Eye Near:   Left Eye Near:  Bilateral Near:         MDM   1. Paronychia of great toe of right foot     Patient seen with Dr. Leonard Schwartz. Blood pressure slightly elevated the right big toe skin is swollen and inflamed on the medial bed of the toenail. After discussion with Dr. Leonard Schwartz will place on Ceftin 500 mg 1 tablet twice a day follow-up with podiatrist if needed.improved also discussed about options of removing the nail as well.     Hassan Rowan, MD 02/09/15 2101

## 2015-02-09 NOTE — ED Notes (Signed)
Ingrown toe nail right inner great toe x 2-3 days. + redness and swelling

## 2015-02-09 NOTE — Discharge Instructions (Signed)
Paronychia  °Paronychia is an infection of the skin. It happens near a fingernail or toenail. It may cause pain and swelling around the nail. Usually, it is not serious and it clears up with treatment. °HOME CARE °· Soak the fingers or toes in warm water as told by your doctor. You may be told to do this for 20 minutes, 2-3 times a day. °· Keep the area dry when you are not soaking it. °· Take medicines only as told by your doctor. °· If you were given an antibiotic medicine, finish all of it even if you start to feel better. °· Keep the affected area clean. °· Do not try to drain a fluid-filled bump yourself. °· Wear rubber gloves when putting your hands in water. °· Wear gloves if your hands might touch cleaners or chemicals. °· Follow your doctor's instructions about: °¨ Wound care. °¨ Bandage (dressing) changes and removal. °GET HELP IF: °· Your symptoms get worse or do not improve. °· You have a fever or chills. °· You have redness spreading from the affected area. °· You have more fluid, blood, or pus coming from the affected area. °· Your finger or knuckle is swollen or is hard to move. °  °This information is not intended to replace advice given to you by your health care provider. Make sure you discuss any questions you have with your health care provider. °  °Document Released: 01/25/2009 Document Revised: 06/23/2014 Document Reviewed: 01/14/2014 °[employer]vier Interactive Patient Education ©2016 [employer] ° °

## 2015-04-15 ENCOUNTER — Emergency Department
Admission: EM | Admit: 2015-04-15 | Discharge: 2015-04-15 | Disposition: A | Discharge status: Home / Self Care | Payer: Medicaid Other | Attending: Emergency Medicine | Admitting: Emergency Medicine

## 2015-04-15 ENCOUNTER — Encounter: Payer: Self-pay | Admitting: Emergency Medicine

## 2015-04-15 DIAGNOSIS — Z791 Long term (current) use of non-steroidal anti-inflammatories (NSAID): Secondary | ICD-10-CM | POA: Diagnosis not present

## 2015-04-15 DIAGNOSIS — Z792 Long term (current) use of antibiotics: Secondary | ICD-10-CM | POA: Insufficient documentation

## 2015-04-15 DIAGNOSIS — I10 Essential (primary) hypertension: Secondary | ICD-10-CM | POA: Insufficient documentation

## 2015-04-15 DIAGNOSIS — Z79899 Other long term (current) drug therapy: Secondary | ICD-10-CM | POA: Insufficient documentation

## 2015-04-15 DIAGNOSIS — M5430 Sciatica, unspecified side: Secondary | ICD-10-CM | POA: Diagnosis not present

## 2015-04-15 DIAGNOSIS — F17210 Nicotine dependence, cigarettes, uncomplicated: Secondary | ICD-10-CM | POA: Insufficient documentation

## 2015-04-15 DIAGNOSIS — M79605 Pain in left leg: Secondary | ICD-10-CM | POA: Diagnosis present

## 2015-04-15 MED ORDER — KETOROLAC TROMETHAMINE 10 MG PO TABS: 10.0000 mg | ORAL_TABLET | Freq: Three times a day (TID) | ORAL | Status: DC

## 2015-04-15 MED ORDER — CYCLOBENZAPRINE HCL 5 MG PO TABS: 5.0000 mg | ORAL_TABLET | Freq: Three times a day (TID) | ORAL | Status: DC | PRN

## 2015-04-15 MED ORDER — KETOROLAC TROMETHAMINE 60 MG/2ML IM SOLN
60.0000 mg | Freq: Once | INTRAMUSCULAR | Status: AC
  Administered 2015-04-15: 60 mg via INTRAMUSCULAR
  Filled 2015-04-15: qty 2

## 2015-04-15 NOTE — ED Provider Notes (Signed)
[facility] Emergency Department Provider Note ____________________________________________  Time seen: 1258  I have reviewed the triage vital signs and the nursing notes.  HISTORY  Chief Complaint  Leg Pain  HPI Nicole Ayala is a 50 y.o. female presents to the ED with complaints of pain from the left hip the left knee that she recently 8 out of 10. She also notes some intermittent tingling in her toes on the left side. She denies any injury, trauma, fall, or accident. She describes onset of her pain this morning about 3 AM. 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 She does not this job for approximately 7 weeks. She describes achy pain that is sharp at times and shooting down the left hip and buttocks into the posterior lateral thigh. She denies any bladder or bowel incontinence or foot drop. She does report a history of sciatic nerve irritation, rates her current discomfort at a 10/10 in triage.  Past Medical History  Diagnosis Date  . Hypertension   . Hypercholesteremia   . Vitamin D deficiency   . GERD (gastroesophageal reflux disease)     There are no active problems to display for this patient.   Past Surgical History  Procedure Laterality Date  . Tubal ligation    . Tonsillectomy      Current Outpatient Rx  Name  Route  Sig  Dispense  Refill  . carvedilol (COREG CR) 10 MG 24 hr capsule   Oral   Take 25 mg by mouth daily.         . cefUROXime (CEFTIN) 500 MG tablet   Oral   Take 1 tablet (500 mg total) by mouth 2 (two) times daily with a meal.   20 tablet   0   . cyclobenzaprine (FLEXERIL) 5 MG tablet   Oral   Take 1 tablet (5 mg total) by mouth every 8 (eight) hours as needed for muscle spasms.   12 tablet   0   . diclofenac (VOLTAREN) 50 MG EC tablet   Oral   Take 50 mg by mouth 2 (two) times daily.         Marland Kitchen ibuprofen (ADVIL,MOTRIN) 600 MG tablet   Oral   Take 1 tablet (600 mg total) by mouth every 8 (eight) hours as needed for mild pain or moderate pain.   15 tablet   0   . ketorolac (TORADOL) 10 MG tablet   Oral   Take 1 tablet (10 mg total) by mouth every 8 (eight) hours.   15 tablet   0   . lisinopril (PRINIVIL,ZESTRIL) 20 MG tablet   Oral   Take 20 mg by mouth daily.         Marland Kitchen omeprazole (PRILOSEC) 40 MG capsule   Oral   Take 40 mg by mouth daily.         . rosuvastatin (CRESTOR) 20 MG tablet   Oral   Take 20 mg by mouth daily.           Allergies Review of patient's allergies indicates no known allergies.  Family History  Problem Relation Age of Onset  . Heart failure Mother   . Cancer Father     Social History Social History  Substance Use Topics  . Smoking status: Current Every Day Smoker -- 1.00 packs/day    Types: Cigarettes  . Smokeless tobacco: None  . Alcohol Use:  No    Review of Systems  Constitutional: Negative for fever. Eyes: Negative for visual changes. ENT: Negative for sore throat. Cardiovascular: Negative for chest pain. Respiratory: Negative for shortness of breath. Gastrointestinal: Negative for abdominal pain, vomiting and diarrhea. Genitourinary: Negative for dysuria. Musculoskeletal: Negative for back pain. Skin: Negative for rash. Neurological: Negative for headaches, focal weakness or numbness. ____________________________________________  PHYSICAL EXAM:  VITAL SIGNS: ED Triage Vitals  Enc Vitals Group     BP 04/15/15 1152 141/81 mmHg     Pulse Rate 04/15/15 1152 72     Resp 04/15/15 1152 24     Temp 04/15/15 1152 98.3 F (36.8 C)     Temp Source 04/15/15 1152 Oral     SpO2 04/15/15 1152 98 %     Weight 04/15/15 1152 170 lb (77.111 kg)     Height 04/15/15 1152 4\' 11"  (1.499 m)     Head Cir --      Peak Flow --      Pain Score 04/15/15 1202 10     Pain Loc --      Pain Edu? --      Excl. in GC? --    Constitutional: Alert and oriented. Well appearing  and in no distress. Head: Normocephalic and atraumatic. Eyes: Conjunctivae are normal. PERRL. Normal extraocular movements Neck: Supple. No thyromegaly. Hematological/Lymphatic/Immunological: No cervical lymphadenopathy. Cardiovascular: Normal rate, regular rhythm. Normal distal pulses bilaterally. Respiratory: Normal respiratory effort. No wheezes/rales/rhonchi. Gastrointestinal: Soft and nontender. No distention. Musculoskeletal: Normal spinal alignment without midline tenderness, spasm, deformity, or step-off. Patient minimally tender to palpation over the left SI joint region. She transitions mostly from sit to stand without difficulty. She is able to perform a standing toe and heel raise without difficulty. She is also present demonstrate standing hip flexion without hip laxity. Nontender with normal range of motion in all extremities.  Neurologic: Cranial nerves II through XII grossly intact. Normal toe dorsiflexion on exam. Normal LE DTRs bilaterally. Negative seated straight leg raise. Normal gait without ataxia. Normal speech and language. No gross focal neurologic deficits are appreciated. Skin:  Skin is warm, dry and intact. No rash noted. Psychiatric: Mood and affect are normal. Patient exhibits appropriate insight and judgment. ____________________________________________  PROCEDURES  Toradol 60 mg IM ____________________________________________  INITIAL IMPRESSION / ASSESSMENT AND PLAN / ED COURSE  Patient with an acute left leg sciatic nerve irritation exam. She otherwise noted to have a normal neuromuscular exam. She'll be discharged with prescription for Toradol and Flexeril dose as directed. Work note is provided for her third shift today as requested. Patient is encouraged to consider the use of compression socks at work. She'll follow up with her primary care provider for ongoing symptom management. ____________________________________________  FINAL CLINICAL  IMPRESSION(S) / ED DIAGNOSES  Final diagnoses:  Sciatic leg pain       04/17/15, PA-C 04/15/15 1409  04/17/15, MD 04/15/15 1539

## 2015-04-15 NOTE — Discharge Instructions (Signed)
Back Exercises °If you have pain in your back, do these exercises 2-3 times each day or as told by your doctor. When the pain goes away, do the exercises once each day, but repeat the steps more times for each exercise (do more repetitions). If you do not have pain in your back, do these exercises once each day or as told by your doctor. °EXERCISES °Single Knee to Chest °Do these steps 3-5 times in a row for each leg: °· Lie on your back on a firm bed or the floor with your legs stretched out. °· Bring one knee to your chest. °· Hold your knee to your chest by grabbing your knee or thigh. °· Pull on your knee until you feel a gentle stretch in your lower back. °· Keep doing the stretch for 10-30 seconds. °· Slowly let go of your leg and straighten it. °Pelvic Tilt °Do these steps 5-10 times in a row: °· Lie on your back on a firm bed or the floor with your legs stretched out. °· Bend your knees so they point up to the ceiling. Your feet should be flat on the floor. °· Tighten your lower belly (abdomen) muscles to press your lower back against the floor. This will make your tailbone point up to the ceiling instead of pointing down to your feet or the floor. °· Stay in this position for 5-10 seconds while you gently tighten your muscles and breathe evenly. °Cat-Cow °Do these steps until your lower back bends more easily: °· Get on your hands and knees on a firm surface. Keep your hands under your shoulders, and keep your knees under your hips. You may put padding under your knees. °· Let your head hang down, and make your tailbone point down to the floor so your lower back is round like the back of a cat. °· Stay in this position for 5 seconds. °· Slowly lift your head and make your tailbone point up to the ceiling so your back hangs low (sags) like the back of a cow. °· Stay in this position for 5 seconds. °Press-Ups °Do these steps 5-10 times in a row: °1. Lie on your belly (face-down) on the floor. °2. Place your  hands near your head, about shoulder-width apart. °3. While you keep your back relaxed and keep your hips on the floor, slowly straighten your arms to raise the top half of your body and lift your shoulders. Do not use your back muscles. To make yourself more comfortable, you may change where you place your hands. °4. Stay in this position for 5 seconds. °5. Slowly return to lying flat on the floor. °Bridges °Do these steps 10 times in a row: °1. Lie on your back on a firm surface. °2. Bend your knees so they point up to the ceiling. Your feet should be flat on the floor. °3. Tighten your butt muscles and lift your butt off of the floor until your waist is almost as high as your knees. If you do not feel the muscles working in your butt and the back of your thighs, slide your feet 1-2 inches farther away from your butt. °4. Stay in this position for 3-5 seconds. °5. Slowly lower your butt to the floor, and let your butt muscles relax. °If this exercise is too easy, try doing it with your arms crossed over your chest. °Belly Crunches °Do these steps 5-10 times in a row: °1. Lie on your back on a firm bed   or the floor with your legs stretched out. 2. Bend your knees so they point up to the ceiling. Your feet should be flat on the floor. 3. Cross your arms over your chest. 4. Tip your chin a little bit toward your chest but do not bend your neck. 5. Tighten your belly muscles and slowly raise your chest just enough to lift your shoulder blades a tiny bit off of the floor. 6. Slowly lower your chest and your head to the floor. Back Lifts Do these steps 5-10 times in a row: 1. Lie on your belly (face-down) with your arms at your sides, and rest your forehead on the floor. 2. Tighten the muscles in your legs and your butt. 3. Slowly lift your chest off of the floor while you keep your hips on the floor. Keep the back of your head in line with the curve in your back. Look at the floor while you do this. 4. Stay  in this position for 3-5 seconds. 5. Slowly lower your chest and your face to the floor. GET HELP IF:  Your back pain gets a lot worse when you do an exercise.  Your back pain does not lessen 2 hours after you exercise. If you have any of these problems, stop doing the exercises. Do not do them again unless your doctor says it is okay. GET HELP RIGHT AWAY IF:  You have sudden, very bad back pain. If this happens, stop doing the exercises. Do not do them again unless your doctor says it is okay.   This information is not intended to replace advice given to you by your health care provider. Make sure you discuss any questions you have with your health care provider.   Document Released: 03/11/2010 Document Revised: 10/28/2014 Document Reviewed: 04/02/2014 Elsevier Interactive Patient Education 2016 [employer]  Sciatica Sciatica is pain, weakness, numbness, or tingling along your sciatic nerve. The nerve starts in the lower back and runs down the back of each leg. Nerve damage or certain conditions pinch or put pressure on the sciatic nerve. This causes the pain, weakness, and other discomforts of sciatica. HOME CARE   Only take medicine as told by your doctor.  Apply ice to the affected area for 20 minutes. Do this 3-4 times a day for the first 48-72 hours. Then try heat in the same way.  Exercise, stretch, or do your usual activities if these do not make your pain worse.  Go to physical therapy as told by your doctor.  Keep all doctor visits as told.  Do not wear high heels or shoes that are not supportive.  Get a firm mattress if your mattress is too soft to lessen pain and discomfort. GET HELP RIGHT AWAY IF:   You cannot control when you poop (bowel movement) or pee (urinate).  You have more weakness in your lower back, lower belly (pelvis), butt (buttocks), or legs.  You have redness or puffiness (swelling) of your back.  You have a burning feeling when you pee.  You  have pain that gets worse when you lie down.  You have pain that wakes you from your sleep.  Your pain is worse than past pain.  Your pain lasts longer than 4 weeks.  You are suddenly losing weight without reason. MAKE SURE YOU:   Understand these instructions.  Will watch this condition.  Will get help right away if you are not doing well or get worse.   This information is not  intended to replace advice given to you by your health care provider. Make sure you discuss any questions you have with your health care provider.   Document Released: 11/16/2007 Document Revised: 10/28/2014 Document Reviewed: 06/18/2011 Elsevier Interactive Patient Education [employer]  Take the prescription meds as directed. Follow-up with Dr. Marvis Moeller for ongoing symptoms. Rest with the leg elevated. Apply ice to reduce symptoms.

## 2015-04-15 NOTE — ED Notes (Signed)
Pt a/o. C/o pain left knee 8/10. + pedal pulse. No obvious injury. Pt denies injury. C/o foot tingling. No obvious injury.

## 2015-04-15 NOTE — ED Notes (Signed)
Patient presents to the ED with left leg pain that began today while working at 3am.  Patient states she has to walk on concrete floors at work and her feet normally hurt somewhat but patient states today she is having left knee pain and is having difficulty walking using her left leg.  Patient states this pain is unusual.  Patient is in no obvious distress at this time.  Sensation and mobility is intact in left foot.  Patient states foot feels, "tingly".  Denies fall or other trauma.

## 2015-04-20 ENCOUNTER — Encounter: Payer: Self-pay | Admitting: Emergency Medicine

## 2015-04-20 ENCOUNTER — Emergency Department
Admission: EM | Admit: 2015-04-20 | Discharge: 2015-04-20 | Disposition: A | Discharge status: Home / Self Care | Payer: Medicaid Other | Attending: Emergency Medicine | Admitting: Emergency Medicine

## 2015-04-20 DIAGNOSIS — Z792 Long term (current) use of antibiotics: Secondary | ICD-10-CM | POA: Diagnosis not present

## 2015-04-20 DIAGNOSIS — R21 Rash and other nonspecific skin eruption: Secondary | ICD-10-CM

## 2015-04-20 DIAGNOSIS — M5431 Sciatica, right side: Secondary | ICD-10-CM | POA: Diagnosis not present

## 2015-04-20 DIAGNOSIS — M5432 Sciatica, left side: Secondary | ICD-10-CM | POA: Diagnosis not present

## 2015-04-20 DIAGNOSIS — F17210 Nicotine dependence, cigarettes, uncomplicated: Secondary | ICD-10-CM | POA: Diagnosis not present

## 2015-04-20 DIAGNOSIS — Z79899 Other long term (current) drug therapy: Secondary | ICD-10-CM | POA: Diagnosis not present

## 2015-04-20 DIAGNOSIS — Z791 Long term (current) use of non-steroidal anti-inflammatories (NSAID): Secondary | ICD-10-CM | POA: Insufficient documentation

## 2015-04-20 DIAGNOSIS — M545 Low back pain: Secondary | ICD-10-CM | POA: Diagnosis present

## 2015-04-20 DIAGNOSIS — I10 Essential (primary) hypertension: Secondary | ICD-10-CM | POA: Diagnosis not present

## 2015-04-20 MED ORDER — METHYLPREDNISOLONE SODIUM SUCC 125 MG IJ SOLR
80.0000 mg | Freq: Once | INTRAMUSCULAR | Status: AC
  Administered 2015-04-20: 80 mg via INTRAMUSCULAR
  Filled 2015-04-20: qty 2

## 2015-04-20 MED ORDER — PREDNISONE 10 MG PO TABS: ORAL_TABLET | ORAL | Status: DC

## 2015-04-20 NOTE — ED Notes (Signed)
Pt alert and oriented X4, active, cooperative, pt in NAD. RR even and unlabored, color WNL.  Pt informed to return if any life threatening symptoms occur.   

## 2015-04-20 NOTE — Discharge Instructions (Signed)
Back Exercises °The following exercises strengthen the muscles that help to support the back. They also help to keep the lower back flexible. Doing these exercises can help to prevent back pain or lessen existing pain. °If you have back pain or discomfort, try doing these exercises 2-3 times each day or as told by your health care provider. When the pain goes away, do them once each day, but increase the number of times that you repeat the steps for each exercise (do more repetitions). If you do not have back pain or discomfort, do these exercises once each day or as told by your health care provider. °EXERCISES °Single Knee to Chest °Repeat these steps 3-5 times for each leg: °· Lie on your back on a firm bed or the floor with your legs extended. °· Bring one knee to your chest. Your other leg should stay extended and in contact with the floor. °· Hold your knee in place by grabbing your knee or thigh. °· Pull on your knee until you feel a gentle stretch in your lower back. °· Hold the stretch for 10-30 seconds. °· Slowly release and straighten your leg. °Pelvic Tilt °Repeat these steps 5-10 times: °· Lie on your back on a firm bed or the floor with your legs extended. °· Bend your knees so they are pointing toward the ceiling and your feet are flat on the floor. °· Tighten your lower abdominal muscles to press your lower back against the floor. This motion will tilt your pelvis so your tailbone points up toward the ceiling instead of pointing to your feet or the floor. °· With gentle tension and even breathing, hold this position for 5-10 seconds. °Cat-Cow °Repeat these steps until your lower back becomes more flexible: °· Get into a hands-and-knees position on a firm surface. Keep your hands under your shoulders, and keep your knees under your hips. You may place padding under your knees for comfort. °· Let your head hang down, and point your tailbone toward the floor so your lower back becomes rounded like the  back of a cat. °· Hold this position for 5 seconds. °· Slowly lift your head and point your tailbone up toward the ceiling so your back forms a sagging arch like the back of a cow. °· Hold this position for 5 seconds. °Press-Ups °Repeat these steps 5-10 times: °1. Lie on your abdomen (face-down) on the floor. °2. Place your palms near your head, about shoulder-width apart. °3. While you keep your back as relaxed as possible and keep your hips on the floor, slowly straighten your arms to raise the top half of your body and lift your shoulders. Do not use your back muscles to raise your upper torso. You may adjust the placement of your hands to make yourself more comfortable. °4. Hold this position for 5 seconds while you keep your back relaxed. °5. Slowly return to lying flat on the floor. °Bridges °Repeat these steps 10 times: °1. Lie on your back on a firm surface. °2. Bend your knees so they are pointing toward the ceiling and your feet are flat on the floor. °3. Tighten your buttocks muscles and lift your buttocks off of the floor until your waist is at almost the same height as your knees. You should feel the muscles working in your buttocks and the back of your thighs. If you do not feel these muscles, slide your feet 1-2 inches farther away from your buttocks. °4. Hold this position for 3-5   seconds. °5. Slowly lower your hips to the starting position, and allow your buttocks muscles to relax completely. °If this exercise is too easy, try doing it with your arms crossed over your chest. °Abdominal Crunches °Repeat these steps 5-10 times: °1. Lie on your back on a firm bed or the floor with your legs extended. °2. Bend your knees so they are pointing toward the ceiling and your feet are flat on the floor. °3. Cross your arms over your chest. °4. Tip your chin slightly toward your chest without bending your neck. °5. Tighten your abdominal muscles and slowly raise your trunk (torso) high enough to lift your  shoulder blades a tiny bit off of the floor. Avoid raising your torso higher than that, because it can put too much stress on your low back and it does not help to strengthen your abdominal muscles. °6. Slowly return to your starting position. °Back Lifts °Repeat these steps 5-10 times: °1. Lie on your abdomen (face-down) with your arms at your sides, and rest your forehead on the floor. °2. Tighten the muscles in your legs and your buttocks. °3. Slowly lift your chest off of the floor while you keep your hips pressed to the floor. Keep the back of your head in line with the curve in your back. Your eyes should be looking at the floor. °4. Hold this position for 3-5 seconds. °5. Slowly return to your starting position. °SEEK MEDICAL CARE IF: °· Your back pain or discomfort gets much worse when you do an exercise. °· Your back pain or discomfort does not lessen within 2 hours after you exercise. °If you have any of these problems, stop doing these exercises right away. Do not do them again unless your health care provider says that you can. °SEEK IMMEDIATE MEDICAL CARE IF: °· You develop sudden, severe back pain. If this happens, stop doing the exercises right away. Do not do them again unless your health care provider says that you can. °  °This information is not intended to replace advice given to you by your health care provider. Make sure you discuss any questions you have with your health care provider. °  °Document Released: 03/16/2004 Document Revised: 10/28/2014 Document Reviewed: 04/02/2014 °Elsevier Interactive Patient Education ©2016 [employer] ° °Sciatica °Sciatica is pain, weakness, numbness, or tingling along your sciatic nerve. The nerve starts in the lower back and runs down the back of each leg. Nerve damage or certain conditions pinch or put pressure on the sciatic nerve. This causes the pain, weakness, and other discomforts of sciatica. °HOME CARE  °· Only take medicine as told by your  doctor. °· Apply ice to the affected area for 20 minutes. Do this 3-4 times a day for the first 48-72 hours. Then try heat in the same way. °· Exercise, stretch, or do your usual activities if these do not make your pain worse. °· Go to physical therapy as told by your doctor. °· Keep all doctor visits as told. °· Do not wear high heels or shoes that are not supportive. °· Get a firm mattress if your mattress is too soft to lessen pain and discomfort. °GET HELP RIGHT AWAY IF:  °· You cannot control when you poop (bowel movement) or pee (urinate). °· You have more weakness in your lower back, lower belly (pelvis), butt (buttocks), or legs. °· You have redness or puffiness (swelling) of your back. °· You have a burning feeling when you pee. °· You have pain   that gets worse when you lie down. °· You have pain that wakes you from your sleep. °· Your pain is worse than past pain. °· Your pain lasts longer than 4 weeks. °· You are suddenly losing weight without reason. °MAKE SURE YOU:  °· Understand these instructions. °· Will watch this condition. °· Will get help right away if you are not doing well or get worse. °  °This information is not intended to replace advice given to you by your health care provider. Make sure you discuss any questions you have with your health care provider. °  °Document Released: 11/16/2007 Document Revised: 10/28/2014 Document Reviewed: 06/18/2011 °Elsevier Interactive Patient Education ©2016 [employer] Inc. ° °

## 2015-04-20 NOTE — ED Notes (Signed)
Lower back pain, hx of such. Aggravated by walking at work. Pt alert and oriented X4, active, cooperative, pt in NAD. RR even and unlabored, color WNL.

## 2015-04-20 NOTE — ED Notes (Signed)
C/o low back pain.  Seen in ED for same last Thursday and diagnosed with Sciatica.  Patient states that pain has worsened and last night pain radiated down both legs.  Also, patient c/o raised red rash to both legs that burns.  Onset last night.  Has been taking toradol and flexeril for pain-- patient took some on Saturday and again this morning after getting home from work.  Denies GI/ GU symptoms.

## 2015-04-20 NOTE — ED Provider Notes (Signed)
[facility] Emergency Department Provider Note  ____________________________________________  Time seen: Approximately 9:59 PM  I have reviewed the triage vital signs and the nursing notes.   HISTORY  Chief Complaint Back Pain    HPI Nicole Ayala is a 50 y.o. female , NAD, presents to the emergency department with one-day history of lower back pain with radiation into bilateral legs. States that she has a history of sciatica and pain is the same. Notes that she does a lot of walking at work and feels that this is exacerbating a lot of for pains. Has not taken anything at home for current symptoms. Also notes that she has a rash on both legs that began last night. Denies itching, loss of bowel or bladder control, no saddle paresthesias, no upper back pain or neck pain, no numbness, weakness, tingling. Does have a primary care provider but has not contacted them over the last couple weeks in regards to her pain.   Past Medical History  Diagnosis Date  . Hypertension   . Hypercholesteremia   . Vitamin D deficiency   . GERD (gastroesophageal reflux disease)     There are no active problems to display for this patient.   Past Surgical History  Procedure Laterality Date  . Tubal ligation    . Tonsillectomy      Current Outpatient Rx  Name  Route  Sig  Dispense  Refill  . carvedilol (COREG CR) 10 MG 24 hr capsule   Oral   Take 25 mg by mouth daily.         . cefUROXime (CEFTIN) 500 MG tablet   Oral   Take 1 tablet (500 mg total) by mouth 2 (two) times daily with a meal.   20 tablet   0   . cyclobenzaprine (FLEXERIL) 5 MG tablet   Oral   Take 1 tablet (5 mg total) by mouth every 8 (eight) hours as needed for muscle spasms.   12 tablet   0   . diclofenac (VOLTAREN) 50 MG EC tablet   Oral   Take 50 mg by mouth 2 (two) times daily.         Marland Kitchen ibuprofen (ADVIL,MOTRIN) 600 MG tablet   Oral   Take 1 tablet (600 mg total) by mouth every 8 (eight)  hours as needed for mild pain or moderate pain.   15 tablet   0   . ketorolac (TORADOL) 10 MG tablet   Oral   Take 1 tablet (10 mg total) by mouth every 8 (eight) hours.   15 tablet   0   . lisinopril (PRINIVIL,ZESTRIL) 20 MG tablet   Oral   Take 20 mg by mouth daily.         Marland Kitchen omeprazole (PRILOSEC) 40 MG capsule   Oral   Take 40 mg by mouth daily.         . predniSONE (DELTASONE) 10 MG tablet      Take a daily regimen of 6,5,4,3,2,1   21 tablet   0   . rosuvastatin (CRESTOR) 20 MG tablet   Oral   Take 20 mg by mouth daily.           Allergies Review of patient's allergies indicates no known allergies.  Family History  Problem Relation Age of Onset  . Heart failure Mother   . Cancer Father     Social History Social History  Substance Use Topics  . Smoking status: Current Every Day Smoker --  1.00 packs/day    Types: Cigarettes  . Smokeless tobacco: None  . Alcohol Use: No     Review of Systems  Constitutional: No fever/chills Eyes: No visual changes.  Cardiovascular: No chest pain. Respiratory: No cough. No shortness of breath. No wheezing.  Gastrointestinal: No abdominal pain.  No nausea, vomiting.  No diarrhea.  No constipation. Genitourinary: Negative for dysuria. No hematuria. No urinary hesitancy, urgency or increased frequency. Musculoskeletal: Positive for back pain radiating to bilateral legs.  Skin: Positive for rash. Neurological: Negative for headaches, focal weakness or numbness. No saddle paraesthesias.  10-point ROS otherwise negative.  ____________________________________________   PHYSICAL EXAM:  VITAL SIGNS: ED Triage Vitals  Enc Vitals Group     BP 04/20/15 2107 129/62 mmHg     Pulse Rate 04/20/15 2107 72     Resp 04/20/15 2107 20     Temp 04/20/15 2107 97.7 F (36.5 C)     Temp Source 04/20/15 2107 Oral     SpO2 04/20/15 2107 97 %     Weight 04/20/15 2107 170 lb (77.111 kg)     Height 04/20/15 2107 4\' 11"  (1.499 m)      Head Cir --      Peak Flow --      Pain Score 04/20/15 2111 10     Pain Loc --      Pain Edu? --      Excl. in GC? --     Constitutional: Patient was asleep when I entered the exam room. Once she was woken, she was alert and oriented. Well appearing and in no acute distress. Eyes: Conjunctivae are normal. PERRL. EOMI without pain.  Head: Atraumatic. Neck: No cervical spine tenderness to palpation. Hematological/Lymphatic/Immunilogical: No cervical lymphadenopathy. Cardiovascular:   Good peripheral circulation. Respiratory: Normal respiratory effort without tachypnea or retractions.  Musculoskeletal: No tenderness to palpation of the lumbar/sacral spinal area. No muscle spasm. FROM of bilateral lower extremities without pain. No lower extremity tenderness nor edema.  No joint effusions. Neurologic:  Normal speech and language. No gross focal neurologic deficits are appreciated.  Skin:  Erythematous patchy rash to bilateral lower extremities. No wheals, no pustules. Is not in a dermatomal alignment.  Skin is warm, dry and intact.  Psychiatric: Mood and affect are normal. Speech and behavior are normal. Patient exhibits appropriate insight and judgement.   ____________________________________________   LABS  None  ____________________________________________  EKG  None ____________________________________________  RADIOLOGY  None  ____________________________________________    PROCEDURES  Procedure(s) performed: None      Medications  methylPREDNISolone sodium succinate (SOLU-MEDROL) 125 mg/2 mL injection 80 mg (80 mg Intramuscular Given 04/20/15 2240)     ____________________________________________   INITIAL IMPRESSION / ASSESSMENT AND PLAN / ED COURSE  Patient's diagnosis is consistent with sciatica. Patient will be discharged home with prescriptions for prednisone to take as directed. Patient is to follow up with her PCP within the next few days to  discuss her recent emergency department visits for pain. Patient is given ED precautions to return to the ED for any worsening or new symptoms.    ____________________________________________  FINAL CLINICAL IMPRESSION(S) / ED DIAGNOSES  Final diagnoses:  Bilateral sciatica  Rash      NEW MEDICATIONS STARTED DURING THIS VISIT:  New Prescriptions   PREDNISONE (DELTASONE) 10 MG TABLET    Take a daily regimen of 6,5,4,3,2,1         04/22/15, PA-C 04/20/15 2257  04/22/15, MD 04/21/15 0009

## 2015-05-21 ENCOUNTER — Encounter: Payer: Self-pay | Admitting: *Deleted

## 2015-05-25 ENCOUNTER — Encounter: Payer: Self-pay | Admitting: Urology

## 2015-05-25 ENCOUNTER — Ambulatory Visit (INDEPENDENT_AMBULATORY_CARE_PROVIDER_SITE_OTHER): Payer: Medicaid Other | Admitting: Urology

## 2015-05-25 VITALS — BP 134/85 | HR 60 | Ht 59.0 in | Wt 168.3 lb

## 2015-05-25 DIAGNOSIS — R1030 Lower abdominal pain, unspecified: Secondary | ICD-10-CM

## 2015-05-25 DIAGNOSIS — N200 Calculus of kidney: Secondary | ICD-10-CM

## 2015-05-25 DIAGNOSIS — G8929 Other chronic pain: Secondary | ICD-10-CM | POA: Diagnosis not present

## 2015-05-25 DIAGNOSIS — R32 Unspecified urinary incontinence: Secondary | ICD-10-CM

## 2015-05-25 LAB — URINALYSIS, COMPLETE
Bilirubin, UA: NEGATIVE
GLUCOSE, UA: NEGATIVE
Ketones, UA: NEGATIVE
Leukocytes, UA: NEGATIVE
NITRITE UA: NEGATIVE
PH UA: 6.5 (ref 5.0–7.5)
Protein, UA: NEGATIVE
RBC, UA: NEGATIVE
Specific Gravity, UA: 1.015 (ref 1.005–1.030)
UUROB: 0.2 mg/dL (ref 0.2–1.0)

## 2015-05-25 LAB — MICROSCOPIC EXAMINATION
BACTERIA UA: NONE SEEN
Epithelial Cells (non renal): >10 /hpf — AB (ref 0–10)

## 2015-05-25 LAB — BLADDER SCAN AMB NON-IMAGING

## 2015-05-25 NOTE — Progress Notes (Signed)
05/25/2015 11:09 AM   Nicole Ayala 1965/09/02 [id number]  Referring provider: Leanna Sato, MD [address] [place], [place] [address]  Chief Complaint  Patient presents with  . Nephrolithiasis    Bilateral referred by Darreld Mclean MD    HPI: Patient is a 50 year old Caucasian female who is referred to Nicole Ayala by her PCP, Dr. Marvis Moeller, for bilateral nephrolithiasis.     Patient states she has been having bilateral groin pain for the last several years.  She cannot describe the pain.  The pain is worsened with walking, sitting up and rising to a standing position.  The pain is relieved when she lays down.  She has not had any radiation of the pain into the flank areas or into the groin.  Patient underwent a CT scan of the abdomen and pelvis with contrast which noted bilateral non obstructing nephrolithiasis.  She was then referred to Nicole Ayala for further evaluation.    She is currently experiencing frequency of urination, urgency, nocturia and leakage of urine.  She reports that she has stress urinary incontinence and urge incontinence. She is having to wear urinary pads on a daily basis. She states that she has been experiencing incontinence for over a year.  Her UA today is unremarkable. Her PVR is 32 mL.  She has not experienced fevers, chills, nausea or vomiting.  She has not experienced dysuria or gross hematuria.  She has not passed a fragment.   PMH: Past Medical History  Diagnosis Date  . Hypertension   . Hypercholesteremia   . Vitamin D deficiency   . GERD (gastroesophageal reflux disease)   . Kidney stones   . Low back pain   . Hot flashes   . Rheumatoid arthritis (HCC)   . Abnormal EKG     Surgical History: Past Surgical History  Procedure Laterality Date  . Tubal ligation    . Tonsillectomy      Home Medications:    Medication List       This list is accurate as of: 05/25/15 11:09 AM.  Always use your most recent med list.               carvedilol 10 MG  24 hr capsule  Commonly known as:  COREG CR  Take 25 mg by mouth daily. Reported on 05/25/2015     carvedilol 25 MG tablet  Commonly known as:  COREG  Take by mouth.     cefUROXime 500 MG tablet  Commonly known as:  CEFTIN  Take 1 tablet (500 mg total) by mouth 2 (two) times daily with a meal.     clonazePAM 0.5 MG tablet  Commonly known as:  KLONOPIN  Take 0.5 mg by mouth.     cyclobenzaprine 5 MG tablet  Commonly known as:  FLEXERIL  Take 1 tablet (5 mg total) by mouth every 8 (eight) hours as needed for muscle spasms.     diclofenac 50 MG EC tablet  Commonly known as:  VOLTAREN  Take 50 mg by mouth 2 (two) times daily. Reported on 05/25/2015     ibuprofen 600 MG tablet  Commonly known as:  ADVIL,MOTRIN  Take 1 tablet (600 mg total) by mouth every 8 (eight) hours as needed for mild pain or moderate pain.     ketorolac 10 MG tablet  Commonly known as:  TORADOL  Take 1 tablet (10 mg total) by mouth every 8 (eight) hours.     lisinopril 20  MG tablet  Commonly known as:  PRINIVIL,ZESTRIL  Take 20 mg by mouth daily. Reported on 05/25/2015     lisinopril-hydrochlorothiazide 20-12.5 MG tablet  Commonly known as:  PRINZIDE,ZESTORETIC  Take by mouth.     omeprazole 40 MG capsule  Commonly known as:  PRILOSEC  Take 40 mg by mouth daily. Reported on 05/25/2015     predniSONE 10 MG tablet  Commonly known as:  DELTASONE  Take a daily regimen of 6,5,4,3,2,1     rosuvastatin 20 MG tablet  Commonly known as:  CRESTOR  Take 20 mg by mouth daily.        Allergies: No Known Allergies  Family History: Family History  Problem Relation Age of Onset  . Heart failure Mother   . Cancer Father   . Hematuria Neg Hx   . Kidney disease Neg Hx   . Bladder Cancer Neg Hx     Social History:  reports that she has been smoking Cigarettes.  She has been smoking about 1.00 pack per day. She does not have any smokeless tobacco history on file. She reports that she does not drink alcohol or  use illicit drugs.  ROS: UROLOGY Frequent Urination?: Yes Hard to postpone urination?: Yes Burning/pain with urination?: No Get up at night to urinate?: Yes Leakage of urine?: Yes Urine stream starts and stops?: No Trouble starting stream?: No Do you have to strain to urinate?: No Blood in urine?: No Urinary tract infection?: No Sexually transmitted disease?: No Injury to kidneys or bladder?: No Painful intercourse?: No Weak stream?: No Currently pregnant?: No Vaginal bleeding?: No Last menstrual period?: No  Gastrointestinal Nausea?: No Vomiting?: No Indigestion/heartburn?: No Diarrhea?: No Constipation?: No  Constitutional Fever: No Night sweats?: No Weight loss?: No Fatigue?: No  Skin Skin rash/lesions?: No Itching?: No  Eyes Blurred vision?: No Double vision?: No  Ears/Nose/Throat Sore throat?: No Sinus problems?: No  Hematologic/Lymphatic Swollen glands?: No Easy bruising?: No  Cardiovascular Leg swelling?: No Chest pain?: No  Respiratory Cough?: No Shortness of breath?: No  Endocrine Excessive thirst?: No  Musculoskeletal Back pain?: Yes Joint pain?: Yes  Neurological Headaches?: No Dizziness?: No  Psychologic Depression?: No Anxiety?: Yes  Physical Exam: BP 134/85 mmHg  Pulse 60  Ht 4\' 11"  (1.499 m)  Wt 168 lb 4.8 oz (76.34 kg)  BMI 33.97 kg/m2  Constitutional: Well nourished. Alert and oriented, No acute distress. HEENT: [place] AT, moist mucus membranes. Trachea midline, no masses. Cardiovascular: No clubbing, cyanosis, or edema. Respiratory: Normal respiratory effort, no increased work of breathing. GI: Abdomen is soft, non tender, non distended, no abdominal masses. Liver and spleen not palpable.  No hernias appreciated.  Stool sample for occult testing is not indicated.   GU: No CVA tenderness.  No bladder fullness or masses.  Normal external genitalia, normal pubic hair distribution, no lesions.  Normal urethral meatus, no  lesions, no prolapse, no discharge.   No urethral masses, tenderness and/or tenderness. No bladder fullness, tenderness or masses. Normal vagina mucosa, good estrogen effect, no discharge, no lesions, good pelvic support, Grade II cystocele is noted.  Patient did have urinary leakage while straining.  No rectocele is noted.  No cervical motion tenderness.  Uterus is freely mobile and non-fixed.  No adnexal/parametria masses or tenderness noted.  Anus and perineum are without rashes or lesions.    Skin: No rashes, bruises or suspicious lesions. Lymph: No cervical or inguinal adenopathy. Neurologic: Grossly intact, no focal deficits, moving all 4 extremities. Psychiatric: Normal  mood and affect.  Laboratory Data: Lab Results  Component Value Date   WBC 9.1 12/25/2013   HGB 15.3 12/25/2013   HCT 44.4 12/25/2013   MCV 95 12/25/2013   PLT 268 12/25/2013    Lab Results  Component Value Date   CREATININE 0.57* 12/25/2013    Lab Results  Component Value Date   AST 29 12/25/2013   Lab Results  Component Value Date   ALT 34 12/25/2013     Urinalysis Results for orders placed or performed in visit on 05/25/15  Microscopic Examination  Result Value Ref Range   WBC, UA 0-5 0 -  5 /hpf   RBC, UA 0-2 0 -  2 /hpf   Epithelial Cells (non renal) >10 (A) 0 - 10 /hpf   Bacteria, UA None seen None seen/Few  Urinalysis, Complete  Result Value Ref Range   Specific Gravity, UA 1.015 1.005 - 1.030   pH, UA 6.5 5.0 - 7.5   Color, UA Yellow Yellow   Appearance Ur Cloudy (A) Clear   Leukocytes, UA Negative Negative   Protein, UA Negative Negative/Trace   Glucose, UA Negative Negative   Ketones, UA Negative Negative   RBC, UA Negative Negative   Bilirubin, UA Negative Negative   Urobilinogen, Ur 0.2 0.2 - 1.0 mg/dL   Nitrite, UA Negative Negative   Microscopic Examination See below:   BLADDER SCAN AMB NON-IMAGING  Result Value Ref Range   Scan Result 13ml     Pertinent Imaging: Results  for TRESSA, MALDONADO (MRN [id number]) as of 05/26/2015 22:13  Ref. Range 05/25/2015 11:08  Scan Result Unknown 36ml    Assessment & Plan:    1. Nephrolithiasis:   I do not believe that her stones are cause of her pain.  Renal colic is intermittent in nature and independent of movements.  The CT scan did not note any obstruction and her UA was negative.  I would suggest monitoring her stones with yearly KUB's.  - Urinalysis, Complete - CULTURE, URINE COMPREHENSIVE  2. Bilateral groin pain:   Patient's UA is unremarkable. Her PVR is minimal. I will send her urine for culture for completeness sake. I do not believe that her groin pain is due to a urological ideology.  She does have a history of rheumatoid arthritis and sciatica.  She is a patient of Dr. Lavenia Atlas. I will refer the patient to him for further evaluation to see if there is a rheumatological or muscle skeletal cause of her groin pain.  3. Incontinence:    Patient will be scheduled to have an appointment with Dr. Sherron Monday for further evaluation and management of her incontinence issues.   Return for Patient would like to have an appointment with Dr. Sherron Monday.  These notes generated with voice recognition software. I apologize for typographical errors.  Michiel Cowboy, PA-C  [facility] [address] [place], [place] [address] [phone]

## 2015-05-26 DIAGNOSIS — N200 Calculus of kidney: Secondary | ICD-10-CM | POA: Insufficient documentation

## 2015-05-26 DIAGNOSIS — R32 Unspecified urinary incontinence: Secondary | ICD-10-CM | POA: Insufficient documentation

## 2015-05-27 LAB — CULTURE, URINE COMPREHENSIVE

## 2015-06-14 ENCOUNTER — Ambulatory Visit (INDEPENDENT_AMBULATORY_CARE_PROVIDER_SITE_OTHER): Payer: Medicaid Other | Admitting: Urology

## 2015-06-14 ENCOUNTER — Telehealth: Payer: Self-pay | Admitting: Urology

## 2015-06-14 VITALS — BP 174/94 | HR 74 | Ht 59.0 in | Wt 169.0 lb

## 2015-06-14 DIAGNOSIS — R32 Unspecified urinary incontinence: Secondary | ICD-10-CM | POA: Diagnosis not present

## 2015-06-14 NOTE — Progress Notes (Signed)
06/14/2015 12:14 PM   Nicole Ayala [birth year] [id number]  Referring provider: Leanna Sato, MD [address] [place], [place] [address]  Chief Complaint  Patient presents with  . Urinary Incontinence    HPI: Nicole Ayala:  Patient underwent a CT scan of the abdomen and pelvis with contrast which noted bilateral non obstructing nephrolithiasis. She was then referred to [place] for further evaluation.  She is currently experiencing frequency of urination, urgency, nocturia and leakage of urine. She reports that she has stress urinary incontinence and urge incontinence. She is having to wear urinary pads on a daily basis. She states that she has been experiencing incontinence for over a year.   Today She had stress incontinence and a small cystocele on pelvic examination  The patient leaks with coughing sneezing and bending which is her primary problem. She also has urgency incontinence and no enuresis and wears one pad per day. She voids every hour but couldn't see through it to our movie. She is a one time a night and has a good flow.  She denies a history of previous GU surgery and urinary tract infections and has no neurologic risk factors symptoms. She's not had a hysterectomy. Her bowel movements are normal  Modifying factors: There are no other modifying factors  Associated signs and symptoms: There are no other associated signs and symptoms Aggravating and relieving factors: There are no other aggravating or relieving factors Severity: Moderate Duration: Persistent    PMH: Past Medical History  Diagnosis Date  . Hypertension   . Hypercholesteremia   . Vitamin D deficiency   . GERD (gastroesophageal reflux disease)   . Kidney stones   . Low back pain   . Hot flashes   . Rheumatoid arthritis (HCC)   . Abnormal EKG     Surgical History: Past Surgical History  Procedure Laterality Date  . Tubal ligation    . Tonsillectomy      Home Medications:    Medication  List       This list is accurate as of: 06/14/15 12:14 PM.  Always use your most recent med list.               carvedilol 25 MG tablet  Commonly known as:  COREG  Take by mouth.     clonazePAM 0.5 MG tablet  Commonly known as:  KLONOPIN  Take 0.5 mg by mouth.     ibuprofen 600 MG tablet  Commonly known as:  ADVIL,MOTRIN  Take 1 tablet (600 mg total) by mouth every 8 (eight) hours as needed for mild pain or moderate pain.     lisinopril-hydrochlorothiazide 20-12.5 MG tablet  Commonly known as:  PRINZIDE,ZESTORETIC  Take by mouth.     omeprazole 40 MG capsule  Commonly known as:  PRILOSEC  Take 40 mg by mouth daily. Reported on 05/25/2015     rosuvastatin 20 MG tablet  Commonly known as:  CRESTOR  Take 20 mg by mouth daily.        Allergies: No Known Allergies  Family History: Family History  Problem Relation Age of Onset  . Heart failure Mother   . Cancer Father   . Hematuria Neg Hx   . Kidney disease Neg Hx   . Bladder Cancer Neg Hx     Social History:  reports that she has been smoking Cigarettes.  She has been smoking about 1.00 pack per day. She does not have any smokeless tobacco history on file. She reports  that she does not drink alcohol or use illicit drugs.  ROS: UROLOGY Frequent Urination?: Yes Hard to postpone urination?: Yes Burning/pain with urination?: No Get up at night to urinate?: Yes Leakage of urine?: No Urine stream starts and stops?: No Trouble starting stream?: No Do you have to strain to urinate?: No Blood in urine?: No Urinary tract infection?: No Sexually transmitted disease?: No Injury to kidneys or bladder?: No Painful intercourse?: No Weak stream?: No Currently pregnant?: No Vaginal bleeding?: No Last menstrual period?: n  Gastrointestinal Nausea?: No Vomiting?: No Indigestion/heartburn?: No Diarrhea?: No Constipation?: No  Constitutional Fever: No Night sweats?: No Weight loss?: No Fatigue?: No  Skin Skin  rash/lesions?: No Itching?: No  Eyes Blurred vision?: No Double vision?: No  Ears/Nose/Throat Sore throat?: No Sinus problems?: No  Hematologic/Lymphatic Swollen glands?: No Easy bruising?: No  Cardiovascular Leg swelling?: No Chest pain?: No  Respiratory Cough?: No Shortness of breath?: No  Endocrine Excessive thirst?: No  Musculoskeletal Back pain?: Yes Joint pain?: Yes  Neurological Headaches?: No Dizziness?: No  Psychologic Depression?: No Anxiety?: No  Physical Exam: BP 174/94 mmHg  Pulse 74  Ht 4\' 11"  (1.499 m)  Wt 169 lb (76.658 kg)  BMI 34.12 kg/m2  Constitutional:  Alert and oriented, No acute distress. HEENT: [place] AT, moist mucus membranes.  Trachea midline, no masses. Cardiovascular: No clubbing, cyanosis, or edema. Respiratory: Normal respiratory effort, no increased work of breathing. GI: Abdomen is soft, nontender, nondistended, no abdominal masses GU: No CVA tenderness. The patient had mild hypermobility of the bladder neck with a negative cough test. She had a suburethral elevation which was nontender. She had a small grade 2 cystocele and grade 1 rectocele that she could not feel and were asymptomatic Skin: No rashes, bruises or suspicious lesions. Lymph: No cervical or inguinal adenopathy. Neurologic: Grossly intact, no focal deficits, moving all 4 extremities. Psychiatric: Normal mood and affect.  Laboratory Data: Lab Results  Component Value Date   WBC 9.1 12/25/2013   HGB 15.3 12/25/2013   HCT 44.4 12/25/2013   MCV 95 12/25/2013   PLT 268 12/25/2013    Lab Results  Component Value Date   CREATININE 0.57* 12/25/2013    No results found for: PSA  No results found for: TESTOSTERONE  No results found for: HGBA1C  Urinalysis    Component Value Date/Time   COLORURINE Yellow 12/25/2013 1900   APPEARANCEUR Cloudy* 05/25/2015 1040   APPEARANCEUR Clear 12/25/2013 1900   LABSPEC 1.030 12/25/2013 1900   PHURINE 5.0 12/25/2013  1900   GLUCOSEU Negative 05/25/2015 1040   GLUCOSEU Negative 12/25/2013 1900   HGBUR Negative 12/25/2013 1900   BILIRUBINUR Negative 05/25/2015 1040   BILIRUBINUR Negative 12/25/2013 1900   KETONESUR Negative 12/25/2013 1900   PROTEINUR Negative 05/25/2015 1040   PROTEINUR Negative 12/25/2013 1900   NITRITE Negative 05/25/2015 1040   NITRITE Negative 12/25/2013 1900   LEUKOCYTESUR Negative 05/25/2015 1040   LEUKOCYTESUR Negative 12/25/2013 1900    Pertinent Imaging: none  Assessment & Plan:  The patient has mild mixed stress and urge incontinence but is bothered or by the stress component. The patient has a small cystocele and rectocele. In my opinion she did have a sling without a cystocele repair. Role of the urodynamics discussed. We will also look for the presence of a diverticulum during the test at this stage I do not plan on ordering an MRI unless something is visualized  The patient is very motivated to be tried to be held with  the surgery  1. Incontinence 2. Increased frequency - Urinalysis, Complete   No Follow-up on file.  Martina Sinner, MD  [facility] [address] [place], [place] [address] [phone]

## 2015-06-14 NOTE — Telephone Encounter (Signed)
Patient has an appointment for UDS on 07-27-15 @ 12:45 And will f/up with dr. Sherron Monday on 08/09/15 @ 10:30 Gave patient the UDS packet in the office and called and spoke with someone in the home and gave them the appt information on 06-14-15   [facility]

## 2015-06-15 LAB — URINALYSIS, COMPLETE
BILIRUBIN UA: NEGATIVE
GLUCOSE, UA: NEGATIVE
KETONES UA: NEGATIVE
LEUKOCYTES UA: NEGATIVE
Nitrite, UA: NEGATIVE
RBC, UA: NEGATIVE
SPEC GRAV UA: 1.015 (ref 1.005–1.030)
UUROB: 0.2 mg/dL (ref 0.2–1.0)
pH, UA: 8.5 — ABNORMAL HIGH (ref 5.0–7.5)

## 2015-06-15 LAB — MICROSCOPIC EXAMINATION
Bacteria, UA: NONE SEEN
Epithelial Cells (non renal): >10 /hpf — AB (ref 0–10)

## 2015-07-01 ENCOUNTER — Ambulatory Visit: Payer: Medicaid Other | Attending: Rheumatology | Admitting: Physical Therapy

## 2015-08-03 ENCOUNTER — Ambulatory Visit: Admission: RE | Admit: 2015-08-03 | Payer: Medicaid Other | Source: Ambulatory Visit | Admitting: Gastroenterology

## 2015-08-03 ENCOUNTER — Encounter: Admission: RE | Payer: Self-pay | Source: Ambulatory Visit

## 2015-08-03 SURGERY — COLONOSCOPY WITH PROPOFOL
Anesthesia: General

## 2015-08-09 ENCOUNTER — Ambulatory Visit: Payer: Medicaid Other | Admitting: Urology

## 2015-08-09 ENCOUNTER — Encounter: Payer: Self-pay | Admitting: Urology

## 2015-09-13 ENCOUNTER — Other Ambulatory Visit: Payer: Self-pay | Admitting: Rheumatology

## 2015-09-13 DIAGNOSIS — M545 Low back pain, unspecified: Secondary | ICD-10-CM

## 2015-09-13 DIAGNOSIS — G8929 Other chronic pain: Secondary | ICD-10-CM

## 2015-09-17 ENCOUNTER — Ambulatory Visit
Admission: RE | Admit: 2015-09-17 | Discharge: 2015-09-17 | Disposition: A | Discharge status: Home / Self Care | Payer: Medicaid Other | Source: Ambulatory Visit | Attending: Rheumatology | Admitting: Rheumatology

## 2015-09-17 DIAGNOSIS — M545 Low back pain, unspecified: Secondary | ICD-10-CM

## 2015-09-17 DIAGNOSIS — M5127 Other intervertebral disc displacement, lumbosacral region: Secondary | ICD-10-CM | POA: Insufficient documentation

## 2015-09-17 DIAGNOSIS — G8929 Other chronic pain: Secondary | ICD-10-CM | POA: Diagnosis present

## 2015-10-04 IMAGING — CR DG FINGER MIDDLE 2+V*R*
3 series · 3 of 3 positions shown · non-contrast
Comparison: No priors.

CLINICAL DATA: 48-year-old female with pain in the right middle
finger.

EXAM:
RIGHT MIDDLE FINGER 2+V

[finger ap]
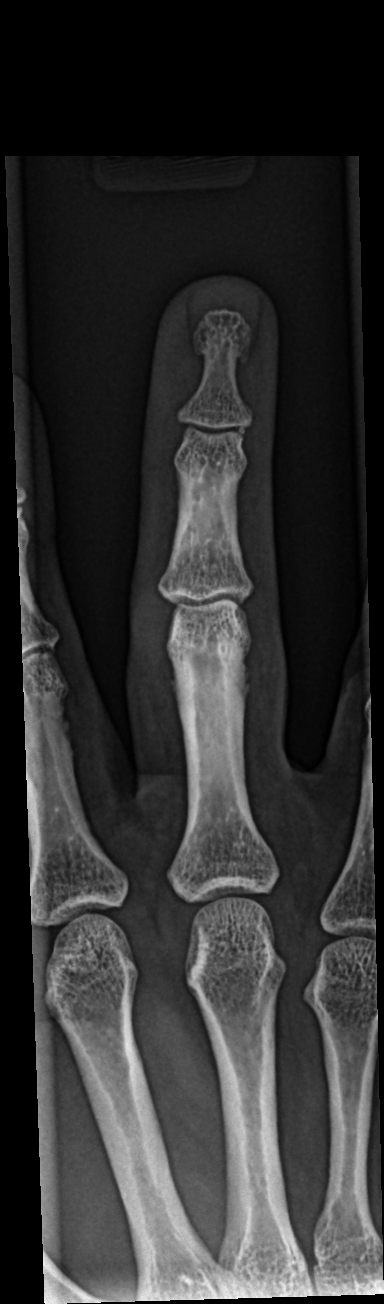

[finger obl]
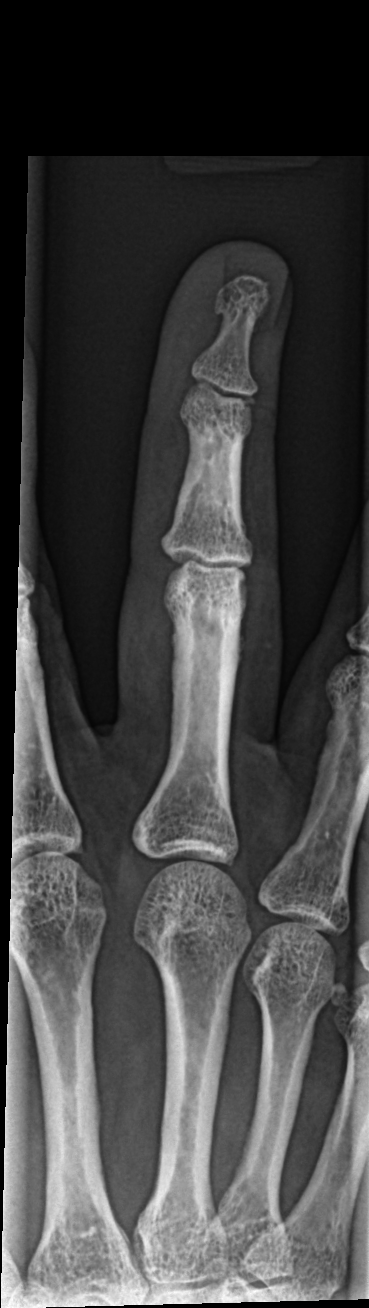

[finger lat]
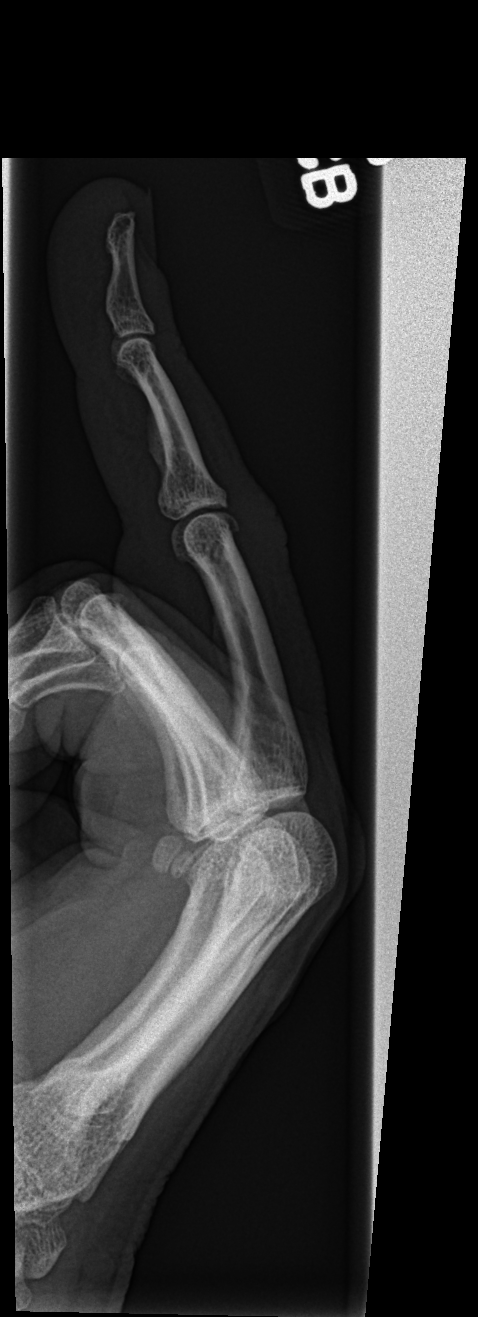

[3 of 3 positions shown; findings below may reference images not displayed]

FINDINGS: Multiple views of the right third finger demonstrate no acute
displaced fracture, subluxation, dislocation, or soft tissue
abnormality.
IMPRESSION: No acute radiographic abnormality of the right middle finger.

## 2015-11-26 ENCOUNTER — Other Ambulatory Visit: Payer: Self-pay | Admitting: Family Medicine

## 2015-11-26 DIAGNOSIS — Z1231 Encounter for screening mammogram for malignant neoplasm of breast: Secondary | ICD-10-CM

## 2015-12-31 ENCOUNTER — Ambulatory Visit: Payer: Medicaid Other | Attending: Family Medicine

## 2016-05-14 ENCOUNTER — Encounter: Payer: Self-pay | Admitting: Gynecology

## 2016-05-14 ENCOUNTER — Ambulatory Visit: Payer: Medicaid Other

## 2016-05-14 ENCOUNTER — Ambulatory Visit
Admission: EM | Admit: 2016-05-14 | Discharge: 2016-05-14 | Disposition: A | Discharge status: Home / Self Care | Payer: Medicaid Other | Attending: Family Medicine | Admitting: Family Medicine

## 2016-05-14 DIAGNOSIS — Z79899 Other long term (current) drug therapy: Secondary | ICD-10-CM | POA: Insufficient documentation

## 2016-05-14 DIAGNOSIS — Z841 Family history of disorders of kidney and ureter: Secondary | ICD-10-CM | POA: Insufficient documentation

## 2016-05-14 DIAGNOSIS — Z809 Family history of malignant neoplasm, unspecified: Secondary | ICD-10-CM | POA: Diagnosis not present

## 2016-05-14 DIAGNOSIS — E559 Vitamin D deficiency, unspecified: Secondary | ICD-10-CM | POA: Insufficient documentation

## 2016-05-14 DIAGNOSIS — M069 Rheumatoid arthritis, unspecified: Secondary | ICD-10-CM | POA: Insufficient documentation

## 2016-05-14 DIAGNOSIS — M10071 Idiopathic gout, right ankle and foot: Secondary | ICD-10-CM

## 2016-05-14 DIAGNOSIS — K219 Gastro-esophageal reflux disease without esophagitis: Secondary | ICD-10-CM | POA: Insufficient documentation

## 2016-05-14 DIAGNOSIS — Z8249 Family history of ischemic heart disease and other diseases of the circulatory system: Secondary | ICD-10-CM | POA: Insufficient documentation

## 2016-05-14 DIAGNOSIS — Z87442 Personal history of urinary calculi: Secondary | ICD-10-CM | POA: Diagnosis not present

## 2016-05-14 DIAGNOSIS — N200 Calculus of kidney: Secondary | ICD-10-CM | POA: Diagnosis not present

## 2016-05-14 DIAGNOSIS — I10 Essential (primary) hypertension: Secondary | ICD-10-CM | POA: Insufficient documentation

## 2016-05-14 DIAGNOSIS — F17210 Nicotine dependence, cigarettes, uncomplicated: Secondary | ICD-10-CM | POA: Insufficient documentation

## 2016-05-14 DIAGNOSIS — M79671 Pain in right foot: Secondary | ICD-10-CM | POA: Diagnosis not present

## 2016-05-14 DIAGNOSIS — E7800 Pure hypercholesterolemia, unspecified: Secondary | ICD-10-CM | POA: Insufficient documentation

## 2016-05-14 DIAGNOSIS — Z9889 Other specified postprocedural states: Secondary | ICD-10-CM | POA: Insufficient documentation

## 2016-05-14 LAB — CBC WITH DIFFERENTIAL/PLATELET
BASOS PCT: 1 %
Basophils Absolute: 0 10*3/uL (ref 0–0.1)
Eosinophils Absolute: 0.1 10*3/uL (ref 0–0.7)
Eosinophils Relative: 1 %
HEMATOCRIT: 40.9 % (ref 35.0–47.0)
HEMOGLOBIN: 14.1 g/dL (ref 12.0–16.0)
LYMPHS ABS: 2.4 10*3/uL (ref 1.0–3.6)
LYMPHS PCT: 34 %
MCH: 31.9 pg (ref 26.0–34.0)
MCHC: 34.4 g/dL (ref 32.0–36.0)
MCV: 92.7 fL (ref 80.0–100.0)
MONO ABS: 0.6 10*3/uL (ref 0.2–0.9)
MONOS PCT: 8 %
NEUTROS PCT: 56 %
Neutro Abs: 4 10*3/uL (ref 1.4–6.5)
PLATELETS: 216 10*3/uL (ref 150–440)
RBC: 4.41 MIL/uL (ref 3.80–5.20)
RDW: 12.8 % (ref 11.5–14.5)
WBC: 7.1 10*3/uL (ref 3.6–11.0)

## 2016-05-14 LAB — COMPREHENSIVE METABOLIC PANEL
ALT: 18 U/L (ref 14–54)
ANION GAP: 5 (ref 5–15)
AST: 19 U/L (ref 15–41)
Albumin: 4 g/dL (ref 3.5–5.0)
Alkaline Phosphatase: 80 U/L (ref 38–126)
BUN: 14 mg/dL (ref 6–20)
CHLORIDE: 107 mmol/L (ref 101–111)
CO2: 25 mmol/L (ref 22–32)
CREATININE: 0.42 mg/dL — AB (ref 0.44–1.00)
Calcium: 9 mg/dL (ref 8.9–10.3)
GFR calc Af Amer: >60 mL/min (ref >60)
Glucose, Bld: 101 mg/dL — ABNORMAL HIGH (ref 65–99)
Potassium: 4 mmol/L (ref 3.5–5.1)
Sodium: 137 mmol/L (ref 135–145)
Total Bilirubin: 0.6 mg/dL (ref 0.3–1.2)
Total Protein: 7.1 g/dL (ref 6.5–8.1)

## 2016-05-14 LAB — URIC ACID: Uric Acid, Serum: 4.7 mg/dL (ref 2.3–6.6)

## 2016-05-14 MED ORDER — INDOMETHACIN 25 MG PO CAPS: 25.0000 mg | ORAL_CAPSULE | Freq: Two times a day (BID) | ORAL | 0 refills | Status: DC

## 2016-05-14 MED ORDER — COLCHICINE 0.6 MG PO TABS: 0.6000 mg | ORAL_TABLET | Freq: Every day | ORAL | 0 refills | Status: DC

## 2016-05-14 MED ORDER — COLCHICINE 0.6 MG PO TABS
1.2000 mg | ORAL_TABLET | Freq: Once | ORAL | Status: AC
  Administered 2016-05-14: 1.2 mg via ORAL

## 2016-05-14 MED ORDER — KETOROLAC TROMETHAMINE 60 MG/2ML IM SOLN
60.0000 mg | Freq: Once | INTRAMUSCULAR | Status: AC
  Administered 2016-05-14: 60 mg via INTRAMUSCULAR

## 2016-05-14 MED ORDER — METHYLPREDNISOLONE SODIUM SUCC 125 MG IJ SOLR
125.0000 mg | Freq: Once | INTRAMUSCULAR | Status: AC
  Administered 2016-05-14: 125 mg via INTRAMUSCULAR

## 2016-05-14 NOTE — ED Provider Notes (Signed)
[facility]    CSN: [id number] Arrival date & time: 05/14/16  1036     History   Chief Complaint Chief Complaint  Patient presents with  . Foot Pain    HPI Nicole Ayala is a 51 y.o. female.   Patient reports Friday afternoon about 3:00 started having pain over the dorsum of the right foot. She denies any trauma or injury to the foot. She states by Friday night she was unable to walk on the foot yesterday she continued to have pain and suffered during the day with pain in the right foot today it has continued so she came in to be seen and evaluated. She emphatically denies any type of trauma or injury to the foot. She has a history of hypertension she's had tubal ligation but denies any other medical problems. Both parents are deceased but she denies any medical problems that would be pertinent to today's visit. She does smoke and was warned she stop smoking. She also has a history of rheumatoid arthritis low back pain kidney stones hyperlipidemia menopausal symptoms. No known drug allergies.   The history is provided by the patient and a relative. No language interpreter was used.  Foot Pain  This is a new problem. The current episode started 2 days ago. The problem occurs constantly. The problem has been gradually worsening. Pertinent negatives include no chest pain, no abdominal pain, no headaches and no shortness of breath. The symptoms are aggravated by walking and standing. Nothing relieves the symptoms. She has tried nothing for the symptoms. The treatment provided no relief.    Past Medical History:  Diagnosis Date  . Abnormal EKG   . GERD (gastroesophageal reflux disease)   . Hot flashes   . Hypercholesteremia   . Hypertension   . Kidney stones   . Low back pain   . Rheumatoid arthritis (HCC)   . Vitamin D deficiency     Patient Active Problem List   Diagnosis Date Noted  . Nephrolithiasis 05/26/2015  . Incontinence 05/26/2015  . Gonalgia 05/13/2014  .  Encounter for screening for malignant neoplasm of colon 05/13/2014  . Soft tissue lesion of shoulder region 12/05/2011    Past Surgical History:  Procedure Laterality Date  . TONSILLECTOMY    . TUBAL LIGATION      OB History    No data available       Home Medications    Prior to Admission medications   Medication Sig Start Date End Date Taking? Authorizing Provider  AMLODIPINE-ATORVASTATIN PO Take by mouth.   Yes Historical Provider, MD  carvedilol (COREG) 25 MG tablet Take by mouth.   Yes Historical Provider, MD  ibuprofen (ADVIL,MOTRIN) 600 MG tablet Take 1 tablet (600 mg total) by mouth every 8 (eight) hours as needed for mild pain or moderate pain. 10/26/14  Yes Renford Dills, NP  lisinopril-hydrochlorothiazide (PRINZIDE,ZESTORETIC) 20-12.5 MG tablet Take by mouth. 10/16/08  Yes Historical Provider, MD  omeprazole (PRILOSEC) 40 MG capsule Take 40 mg by mouth daily. Reported on 05/25/2015   Yes Historical Provider, MD  rosuvastatin (CRESTOR) 20 MG tablet Take 20 mg by mouth daily.   Yes Historical Provider, MD  clonazePAM (KLONOPIN) 0.5 MG tablet Take 0.5 mg by mouth. 12/14/06   Historical Provider, MD  colchicine 0.6 MG tablet Take 1 tablet (0.6 mg total) by mouth daily. 05/14/16   Hassan Rowan, MD  indomethacin (INDOCIN) 25 MG capsule Take 1 capsule (25 mg total) by mouth 2 (two) times  daily with a meal. 05/14/16   Hassan Rowan, MD    Family History Family History  Problem Relation Age of Onset  . Heart failure Mother   . Cancer Father   . Hematuria Neg Hx   . Kidney disease Neg Hx   . Bladder Cancer Neg Hx     Social History Social History  Substance Use Topics  . Smoking status: Current Every Day Smoker    Packs/day: 1.00    Types: Cigarettes  . Smokeless tobacco: Never Used  . Alcohol use No     Allergies   Patient has no known allergies.   Review of Systems Review of Systems  Respiratory: Negative for shortness of breath.   Cardiovascular: Negative for  chest pain.  Gastrointestinal: Negative for abdominal pain.  Musculoskeletal: Positive for joint swelling and myalgias.  Neurological: Negative for headaches.  All other systems reviewed and are negative.    Physical Exam Triage Vital Signs ED Triage Vitals  Enc Vitals Group     BP 05/14/16 1050 (!) 159/88     Pulse Rate 05/14/16 1050 62     Resp 05/14/16 1050 16     Temp 05/14/16 1050 97.6 F (36.4 C)     Temp Source 05/14/16 1050 Oral     SpO2 05/14/16 1050 98 %     Weight 05/14/16 1053 155 lb (70.3 kg)     Height 05/14/16 1053 4\' 11"  (1.499 m)     Head Circumference --      Peak Flow --      Pain Score 05/14/16 1055 10     Pain Loc --      Pain Edu? --      Excl. in GC? --    No data found.   Updated Vital Signs BP (!) 159/88 (BP Location: Left Arm)   Pulse 62   Temp 97.6 F (36.4 C) (Oral)   Resp 16   Ht 4\' 11"  (1.499 m)   Wt 155 lb (70.3 kg)   SpO2 98%   BMI 31.31 kg/m   Visual Acuity Right Eye Distance:   Left Eye Distance:   Bilateral Distance:    Right Eye Near:   Left Eye Near:    Bilateral Near:     Physical Exam  Constitutional: She appears well-developed and well-nourished.  HENT:  Head: Normocephalic and atraumatic.  Eyes: Pupils are equal, round, and reactive to light.  Neck: Normal range of motion.  Pulmonary/Chest: Effort normal.  Musculoskeletal: She exhibits edema and tenderness.       Right foot: There is tenderness, bony tenderness and swelling. There is no deformity and no laceration.       Feet:  Tenderness redness and warmth over the right instep of the right foot ankle joint appears be intact pulses intact as well.  Neurological: She is alert.  Skin: There is erythema.  Psychiatric: She has a normal mood and affect.  Vitals reviewed.    UC Treatments / Results  Labs (all labs ordered are listed, but only abnormal results are displayed) Labs Reviewed  COMPREHENSIVE METABOLIC PANEL - Abnormal; Notable for the following:        Result Value   Glucose, Bld 101 (*)    Creatinine, Ser 0.42 (*)    All other components within normal limits  CBC WITH DIFFERENTIAL/PLATELET  URIC ACID    EKG  EKG Interpretation None       Radiology Dg Foot Complete Right  Result Date: 05/14/2016 CLINICAL  DATA:  Right foot pain since Friday w/no injury. Pain on top and lateral aspect of right foot. No related PMH. EXAM: RIGHT FOOT COMPLETE - 3+ VIEW COMPARISON:  02/17/2012 FINDINGS: No acute fracture or subluxation. Small plantar spur is present. No radiopaque foreign body or soft tissue gas. IMPRESSION: No evidence for acute  abnormality. Electronically Signed   By: Norva Pavlov M.D.   On: 05/14/2016 12:01    Procedures Procedures (including critical care time)  Medications Ordered in UC Medications  methylPREDNISolone sodium succinate (SOLU-MEDROL) 125 mg/2 mL injection 125 mg (125 mg Intramuscular Given 05/14/16 1206)  ketorolac (TORADOL) injection 60 mg (60 mg Intramuscular Given 05/14/16 1203)  colchicine tablet 1.2 mg (1.2 mg Oral Given 05/14/16 1202)     Initial Impression / Assessment and Plan / UC Course  I have reviewed the triage vital signs and the nursing notes.  Pertinent labs & imaging results that were available during my care of the patient were reviewed by me and considered in my medical decision making (see chart for details).     Strongly suspect gout. Will obtain x-ray of the right foot make sure no occult fractures occurred obtain a uric acid CMP and CBC will Mr. 25 mg Solu-Medrol 60 Toradol will 0.2 mg colchicine and away from results to come back.  Final Clinical Impressions(s) / UC Diagnoses   Final diagnoses:  Foot pain, right    New Prescriptions New Prescriptions   COLCHICINE 0.6 MG TABLET    Take 1 tablet (0.6 mg total) by mouth daily.   INDOMETHACIN (INDOCIN) 25 MG CAPSULE    Take 1 capsule (25 mg total) by mouth 2 (two) times daily with a meal.   This time uric acid level was  normal. I did explain to patient that tach of gout uric acid levels and will be normal. Do not feel comfortable at this time trying to aspirate fluid from the instep area. She does have a relationship with a local rheumatologist Dr. Earlene Plater: [facility]. I've asked her to follow-up with him especially if she starts having more trouble or things do not completely resolve in the next few days. Work note given for today. She does report that after getting the Solu-Medrol Toradol and glucose in the foot feels much better. X-ray was negative. Will give her a prescription for Indocin 25 mg take 1 capsule twice a day with meals colchicine 0.6 mg daily for 2 weeks but because the uric acid level was first normal on treat for 2 weeks. Was given follow-up with rheumatologist.    Note: This dictation was prepared with Dragon dictation along with smaller phrase technology. Any transcriptional errors that result from this process are unintentional.     Hassan Rowan, MD 05/14/16 1254

## 2016-05-14 NOTE — ED Triage Notes (Signed)
Patient c/o right foot pain.

## 2016-09-02 ENCOUNTER — Ambulatory Visit
Admission: EM | Admit: 2016-09-02 | Discharge: 2016-09-02 | Disposition: A | Discharge status: Home / Self Care | Payer: Medicaid Other | Attending: Family Medicine | Admitting: Family Medicine

## 2016-09-02 DIAGNOSIS — M5441 Lumbago with sciatica, right side: Secondary | ICD-10-CM

## 2016-09-02 MED ORDER — OXYCODONE-ACETAMINOPHEN 5-325 MG PO TABS: 1.0000 | ORAL_TABLET | Freq: Three times a day (TID) | ORAL | 0 refills | Status: DC | PRN

## 2016-09-02 MED ORDER — PREDNISONE 10 MG PO TABS: ORAL_TABLET | ORAL | 0 refills | Status: DC

## 2016-09-02 MED ORDER — CYCLOBENZAPRINE HCL 10 MG PO TABS: 10.0000 mg | ORAL_TABLET | Freq: Every evening | ORAL | 0 refills | Status: DC | PRN

## 2016-09-02 NOTE — ED Provider Notes (Signed)
[facility] ____________________________________________   Time seen: Approximately 1030 AM  I have reviewed the triage vital signs and the nursing notes.   HISTORY  Chief Complaint Back Pain  HPI Nicole Ayala is a 51 y.o. female presenting for complaint of low back pain with radiation down right leg. Patient reports that she has had a chronic low back pain that intermittently reoccurs, but states this feels worse. Patient states she has not had back pain like this for some time. 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 Reports this past week she has been working in a job that requires her to nail items down while crawling. Reports she has been doing a lot of bending and twisting and crawling throughout this week. Patient reports yesterday during the day she felt like she may have caused pain to increase. Patient states yesterday she did feel more pain in her back while at work than she was earlier in the week and reports pain has continued to increase. Denies any abrupt onset of pain. Denies any onset with lifting or bending. Denies fall, direct injury or direct trauma.  Patient reports the pain is in her low back across her low back with intermittent radiation down her right side of her leg. States the radiation pain is a burning discomfort, denies any full loss of sensation. Denies any urinary or bowel retention or incontinence. Denies decreased range of motion fevers, swelling, erythema or skin changes. Patient again denies fall. Denies Worker's Compensation injury. Reports has continued to remain ambulatory. Reports has had previous known issues to her low back and has had previous joint injections into low back, but states no recent back issues. Reports continues to eat and drink well. Reports has taken multiple doses of over-the-counter Tylenol and ibuprofen without change in pain.  Denies chest pain, shortness of breath, abdominal pain,  dysuria, extremity pain, extremity swelling or rash. Denies recent sickness. Denies recent antibiotic use.   Leanna Sato, MD: PCP   Past Medical History:  Diagnosis Date  . Abnormal EKG   . GERD (gastroesophageal reflux disease)   . Hot flashes   . Hypercholesteremia   . Hypertension   . Kidney stones   . Low back pain   . Rheumatoid arthritis (HCC)   . Vitamin D deficiency     Patient Active Problem List   Diagnosis Date Noted  . Nephrolithiasis 05/26/2015  . Incontinence 05/26/2015  . Gonalgia 05/13/2014  . Encounter for screening for malignant neoplasm of colon 05/13/2014  . Soft tissue lesion of shoulder region 12/05/2011    Past Surgical History:  Procedure Laterality Date  . TONSILLECTOMY    . TUBAL LIGATION       No current facility-administered medications for this encounter.   Current Outpatient Prescriptions:  .  AMLODIPINE-ATORVASTATIN PO, Take by mouth., Disp: , Rfl:  .  carvedilol (COREG) 25 MG tablet, Take by mouth., Disp: , Rfl:  .  clonazePAM (KLONOPIN) 0.5 MG tablet, Take 0.5 mg by mouth., Disp: , Rfl:  .  ibuprofen (ADVIL,MOTRIN) 600 MG tablet, Take 1 tablet (600 mg total) by mouth every 8 (eight) hours as needed for mild pain or moderate pain., Disp: 15 tablet, Rfl: 0 .  lisinopril-hydrochlorothiazide (PRINZIDE,ZESTORETIC) 20-12.5 MG tablet, Take by mouth., Disp: , Rfl:  .  omeprazole (PRILOSEC) 40 MG capsule, Take 40 mg by mouth daily. Reported on 05/25/2015, Disp: , Rfl:  .  rosuvastatin (CRESTOR) 20 MG tablet, Take 20  mg by mouth daily., Disp: , Rfl:  .  colchicine 0.6 MG tablet, Take 1 tablet (0.6 mg total) by mouth daily., Disp: 14 tablet, Rfl: 0 .  cyclobenzaprine (FLEXERIL) 10 MG tablet, Take 1 tablet (10 mg total) by mouth at bedtime as needed for muscle spasms. Do not drive while taking as can cause drowsiness, Disp: 10 tablet, Rfl: 0 .  indomethacin (INDOCIN) 25 MG capsule, Take 1 capsule (25 mg total) by mouth 2 (two) times daily with a  meal., Disp: 30 capsule, Rfl: 0 .  oxyCODONE-acetaminophen (ROXICET) 5-325 MG tablet, Take 1 tablet by mouth every 8 (eight) hours as needed for moderate pain or severe pain (Do not drive or operate heavy machinery while taking as can cause drowsiness.)., Disp: 8 tablet, Rfl: 0 .  predniSONE (DELTASONE) 10 MG tablet, Start 60 mg po day one, then 50 mg po day two, taper by 10 mg daily until complete., Disp: 21 tablet, Rfl: 0  Allergies Patient has no known allergies.  Family History  Problem Relation Age of Onset  . Heart failure Mother   . Cancer Father   . Hematuria Neg Hx   . Kidney disease Neg Hx   . Bladder Cancer Neg Hx     Social History Social History  Substance Use Topics  . Smoking status: Current Every Day Smoker    Packs/day: 1.00    Types: Cigarettes  . Smokeless tobacco: Never Used  . Alcohol use No    Review of Systems Constitutional: No fever/chills Cardiovascular: Denies chest pain. Respiratory: Denies shortness of breath. Gastrointestinal: No abdominal pain.  No nausea, no vomiting.  No diarrhea.  No constipation. Genitourinary: Negative for dysuria. Musculoskeletal: Positive for back pain. Skin: Negative for rash.  ____________________________________________   PHYSICAL EXAM:  VITAL SIGNS: ED Triage Vitals  Enc Vitals Group     BP 09/02/16 0955 (!) 147/81     Pulse Rate 09/02/16 0955 66     Resp 09/02/16 0955 16     Temp 09/02/16 0955 97.6 F (36.4 C)     Temp Source 09/02/16 0955 Oral     SpO2 09/02/16 0955 99 %     Weight 09/02/16 0956 160 lb (72.6 kg)     Height 09/02/16 0956 4\' 11"  (1.499 m)     Head Circumference --      Peak Flow --      Pain Score 09/02/16 0956 10     Pain Loc --      Pain Edu? --      Excl. in GC? --     Constitutional: Alert and oriented. Well appearing and in no acute distress. ENT      Head: Normocephalic and atraumatic.      Mouth/Throat: Mucous membranes are moist.. Cardiovascular: Normal rate, regular  rhythm. Grossly normal heart sounds.  Good peripheral circulation. Respiratory: Normal respiratory effort without tachypnea nor retractions. Breath sounds are clear and equal bilaterally. No wheezes, rales, rhonchi. Gastrointestinal: Soft and nontender. No distention. No CVA tenderness. Musculoskeletal:  Nontender with normal range of motion in all extremities. No midline cervical, thoracic or lumbar tenderness to palpation. Bilateral pedal pulses equal and easily palpated.      Right lower leg:  No tenderness or edema.      Left lower leg:  No tenderness or edema.  No midline tenderness. Minimal left lower sciatic notch topalpation. Mild to moderate direct palpation tenderness to right greater sciatic notch area, no point bony tenderness, no swelling or ecchymosis,  no saddle anesthesia. Changes position from sitting to ambulating quickly in room. No pain with standing bilateral heel lifts. Bilateral plantar flexion and dorsiflexion strong and equal. 2+ bilateral Achilles reflexes. Patellar reflexes deferred as patient with chronic right knee pain.5/5 strength to bilateral upper and lower extremities. Sensation intact to bilateral lower legs. Pain present with lumbar rotation right and left as well as lumbar flexion and extension with guarding of range of motion. Neurologic:  Normal speech and language. No gross focal neurologic deficits are appreciated. Speech is normal. No gait instability.  Skin:  Skin is warm, dry.  Psychiatric: Mood and affect are normal. Speech and behavior are normal. Patient exhibits appropriate insight and judgment   ___________________________________________   LABS (all labs ordered are listed, but only abnormal results are displayed)  Labs Reviewed - No data to display  RADIOLOGY  No results found. ____________________________________________   PROCEDURES Procedures    INITIAL IMPRESSION / ASSESSMENT AND PLAN / ED COURSE  Pertinent labs & imaging results  that were available during my care of the patient were reviewed by me and considered in my medical decision making (see chart for details).  Well-appearing patient. No acute distress. Patient with low back pain with right leg radiation. Patient has had previous MRIs with disc bulge issues. No point reproducible midline tenderness, patient denies fall or trauma. No focal neurological deficits. Continues to remain ambulatory with full range of motion present. Discussed in detail with patient suspect strain injury with inflammation and right-sided sciatica as well as likely disc issue. Discussed in detail with patient strict follow-up and return parameters. Counseled to procedure to the emergency room for any increasing pain, paresthesias, decreased range of motion, urinary or bowel changes. Patient states that she plans to go Monday to follow-up with her orthopedic at [facility] and will go to [facility] orthopedic walk-in clinic as needed for follow-up for her back pain. Will treat patient with oral prednisone, and when necessary Flexeril at night and when necessary Percocet as needed for breakthrough pain. Discussed indication, risks and benefits of medications with patient.   [place] controlled substance database reviewed, and most recent documented recurrent clonazepam.   Discussed follow up with Primary care physician this week. Discussed follow up and return parameters including no resolution or any worsening concerns. Patient verbalized understanding and agreed to plan.   ____________________________________________   FINAL CLINICAL IMPRESSION(S) / ED DIAGNOSES  Final diagnoses:  Acute bilateral low back pain with right-sided sciatica     Discharge Medication List as of 09/02/2016 10:25 AM    START taking these medications   Details  cyclobenzaprine (FLEXERIL) 10 MG tablet Take 1 tablet (10 mg total) by mouth at bedtime as needed for muscle spasms. Do not drive while taking as can cause  drowsiness, Starting Sat 09/02/2016, Normal    oxyCODONE-acetaminophen (ROXICET) 5-325 MG tablet Take 1 tablet by mouth every 8 (eight) hours as needed for moderate pain or severe pain (Do not drive or operate heavy machinery while taking as can cause drowsiness.)., Starting Sat 09/02/2016, Print    predniSONE (DELTASONE) 10 MG tablet Start 60 mg po day one, then 50 mg po day two, taper by 10 mg daily until complete., Normal        Note: This dictation was prepared with Dragon dictation along with smaller phrase technology. Any transcriptional errors that result from this process are unintentional.         Renford Dills, NP 09/02/16 1127

## 2016-09-02 NOTE — ED Triage Notes (Signed)
This started yesterday at about 2:00 pm while at work (not worker's compensation). 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 She states the pain radiates down the right leg, and her left foot is numb and tingling. Pain is exacerbated by sitting, but relieved while sitting with her back against something. Denies urinary sx. She has tried IBU, APAP, without relief.

## 2016-09-02 NOTE — Discharge Instructions (Signed)
Take medication as prescribed. Rest. Drink plenty of fluids. Ice.   Follow up with your orthopedic this week.   Follow up with your primary care physician this week as needed. Return to Urgent care or Emergency room for increased pain, urinary or bowel changes, difficulty walking, new or worsening concerns.

## 2017-03-19 ENCOUNTER — Encounter (INDEPENDENT_AMBULATORY_CARE_PROVIDER_SITE_OTHER): Payer: Self-pay

## 2017-03-19 ENCOUNTER — Ambulatory Visit: Payer: Medicaid Other | Admitting: Pharmacy Technician

## 2017-03-19 DIAGNOSIS — Z79899 Other long term (current) drug therapy: Secondary | ICD-10-CM

## 2017-03-22 NOTE — Progress Notes (Signed)
  Completed [facility] application and contract.  Patient agreed to all terms of the [facility] contract.    Patient approved to receive medication assistance at [facility] through 2019, as long as eligibility criteria continues to be met.    Provided patient with community resource material based on her particular needs.    Referred patient to [facility].  [place] [facility]

## 2017-04-03 ENCOUNTER — Ambulatory Visit: Payer: Self-pay

## 2017-10-12 ENCOUNTER — Telehealth: Payer: Self-pay | Admitting: Pharmacy Technician

## 2017-10-12 NOTE — Telephone Encounter (Signed)
Patient has Medicaid.  Patient acknowledged that she understood [facility] would no longer be able to provide medication assistance.  [name] Care Manager [facility]

## 2017-11-17 ENCOUNTER — Inpatient Hospital Stay (HOSPITAL_COMMUNITY)
Admit: 2017-11-17 | Discharge: 2017-11-17 | Disposition: A | Discharge status: Home / Self Care | Payer: Medicaid Other | Attending: Internal Medicine | Admitting: Internal Medicine

## 2017-11-17 ENCOUNTER — Encounter
Admission: EM | Disposition: A | Discharge status: Home / Self Care | Payer: Self-pay | Source: Home / Self Care | Attending: Internal Medicine

## 2017-11-17 ENCOUNTER — Inpatient Hospital Stay
Admission: EM | Admit: 2017-11-17 | Discharge: 2017-11-19 | DRG: 247 | Disposition: A | Discharge status: Home / Self Care | Payer: Medicaid Other | Attending: Internal Medicine | Admitting: Internal Medicine

## 2017-11-17 ENCOUNTER — Other Ambulatory Visit: Payer: Self-pay

## 2017-11-17 DIAGNOSIS — I2510 Atherosclerotic heart disease of native coronary artery without angina pectoris: Secondary | ICD-10-CM

## 2017-11-17 DIAGNOSIS — Z23 Encounter for immunization: Secondary | ICD-10-CM | POA: Diagnosis not present

## 2017-11-17 DIAGNOSIS — Z8249 Family history of ischemic heart disease and other diseases of the circulatory system: Secondary | ICD-10-CM

## 2017-11-17 DIAGNOSIS — Z716 Tobacco abuse counseling: Secondary | ICD-10-CM | POA: Diagnosis not present

## 2017-11-17 DIAGNOSIS — Z87442 Personal history of urinary calculi: Secondary | ICD-10-CM | POA: Diagnosis not present

## 2017-11-17 DIAGNOSIS — E559 Vitamin D deficiency, unspecified: Secondary | ICD-10-CM | POA: Diagnosis present

## 2017-11-17 DIAGNOSIS — I255 Ischemic cardiomyopathy: Secondary | ICD-10-CM | POA: Diagnosis present

## 2017-11-17 DIAGNOSIS — Z79899 Other long term (current) drug therapy: Secondary | ICD-10-CM

## 2017-11-17 DIAGNOSIS — E782 Mixed hyperlipidemia: Secondary | ICD-10-CM | POA: Diagnosis present

## 2017-11-17 DIAGNOSIS — K219 Gastro-esophageal reflux disease without esophagitis: Secondary | ICD-10-CM | POA: Diagnosis present

## 2017-11-17 DIAGNOSIS — E785 Hyperlipidemia, unspecified: Secondary | ICD-10-CM | POA: Diagnosis not present

## 2017-11-17 DIAGNOSIS — M069 Rheumatoid arthritis, unspecified: Secondary | ICD-10-CM | POA: Diagnosis present

## 2017-11-17 DIAGNOSIS — Z955 Presence of coronary angioplasty implant and graft: Secondary | ICD-10-CM | POA: Diagnosis not present

## 2017-11-17 DIAGNOSIS — I10 Essential (primary) hypertension: Secondary | ICD-10-CM | POA: Diagnosis present

## 2017-11-17 DIAGNOSIS — I5030 Unspecified diastolic (congestive) heart failure: Secondary | ICD-10-CM | POA: Diagnosis not present

## 2017-11-17 DIAGNOSIS — I2102 ST elevation (STEMI) myocardial infarction involving left anterior descending coronary artery: Secondary | ICD-10-CM | POA: Diagnosis present

## 2017-11-17 DIAGNOSIS — M109 Gout, unspecified: Secondary | ICD-10-CM | POA: Diagnosis present

## 2017-11-17 DIAGNOSIS — F17210 Nicotine dependence, cigarettes, uncomplicated: Secondary | ICD-10-CM | POA: Diagnosis present

## 2017-11-17 DIAGNOSIS — I213 ST elevation (STEMI) myocardial infarction of unspecified site: Secondary | ICD-10-CM | POA: Diagnosis present

## 2017-11-17 HISTORY — PX: CORONARY/GRAFT ACUTE MI REVASCULARIZATION: CATH118305

## 2017-11-17 HISTORY — PX: LEFT HEART CATH AND CORONARY ANGIOGRAPHY: CATH118249

## 2017-11-17 LAB — CBC
HEMATOCRIT: 43.5 % (ref 35.0–47.0)
HEMOGLOBIN: 15.1 g/dL (ref 12.0–16.0)
MCH: 32.4 pg (ref 26.0–34.0)
MCHC: 34.7 g/dL (ref 32.0–36.0)
MCV: 93.4 fL (ref 80.0–100.0)
Platelets: 239 10*3/uL (ref 150–440)
RBC: 4.66 MIL/uL (ref 3.80–5.20)
RDW: 13 % (ref 11.5–14.5)
WBC: 6.5 10*3/uL (ref 3.6–11.0)

## 2017-11-17 LAB — PROTIME-INR
INR: 0.83
Prothrombin Time: 11.3 seconds — ABNORMAL LOW (ref 11.4–15.2)

## 2017-11-17 LAB — CREATININE, SERUM: CREATININE: 0.59 mg/dL (ref 0.44–1.00)

## 2017-11-17 LAB — BASIC METABOLIC PANEL
Anion gap: 9 (ref 5–15)
BUN: 21 mg/dL — ABNORMAL HIGH (ref 6–20)
CHLORIDE: 103 mmol/L (ref 98–111)
CO2: 27 mmol/L (ref 22–32)
CREATININE: 0.57 mg/dL (ref 0.44–1.00)
Calcium: 8.9 mg/dL (ref 8.9–10.3)
GFR calc non Af Amer: >60 mL/min (ref >60)
GLUCOSE: 81 mg/dL (ref 70–99)
Potassium: 3.6 mmol/L (ref 3.5–5.1)
Sodium: 139 mmol/L (ref 135–145)

## 2017-11-17 LAB — COMPREHENSIVE METABOLIC PANEL
ALT: 26 U/L (ref 0–44)
ANION GAP: 6 (ref 5–15)
AST: 36 U/L (ref 15–41)
Albumin: 3.4 g/dL — ABNORMAL LOW (ref 3.5–5.0)
Alkaline Phosphatase: 76 U/L (ref 38–126)
BUN: 21 mg/dL — AB (ref 6–20)
CALCIUM: 8.9 mg/dL (ref 8.9–10.3)
CO2: 29 mmol/L (ref 22–32)
Chloride: 103 mmol/L (ref 98–111)
Creatinine, Ser: 0.55 mg/dL (ref 0.44–1.00)
GFR calc Af Amer: >60 mL/min (ref >60)
GFR calc non Af Amer: >60 mL/min (ref >60)
GLUCOSE: 86 mg/dL (ref 70–99)
Potassium: 3.6 mmol/L (ref 3.5–5.1)
SODIUM: 138 mmol/L (ref 135–145)
TOTAL PROTEIN: 6.1 g/dL — AB (ref 6.5–8.1)
Total Bilirubin: 0.4 mg/dL (ref 0.3–1.2)

## 2017-11-17 LAB — POCT ACTIVATED CLOTTING TIME
ACTIVATED CLOTTING TIME: 241 s
Activated Clotting Time: 235 seconds

## 2017-11-17 LAB — MRSA PCR SCREENING: MRSA by PCR: NEGATIVE

## 2017-11-17 LAB — TROPONIN I: TROPONIN I: 2.17 ng/mL — AB (ref <0.03)

## 2017-11-17 LAB — GLUCOSE, CAPILLARY: Glucose-Capillary: 79 mg/dL (ref 70–99)

## 2017-11-17 LAB — APTT: aPTT: 29 seconds (ref 24–36)

## 2017-11-17 SURGERY — CORONARY/GRAFT ACUTE MI REVASCULARIZATION
Anesthesia: Moderate Sedation

## 2017-11-17 MED ORDER — ACETAMINOPHEN 325 MG PO TABS
650.0000 mg | ORAL_TABLET | ORAL | Status: DC | PRN
  Filled 2017-11-17: qty 2

## 2017-11-17 MED ORDER — SODIUM CHLORIDE 0.9% FLUSH
3.0000 mL | Freq: Two times a day (BID) | INTRAVENOUS | Status: DC
  Administered 2017-11-17 – 2017-11-18 (×3): 3 mL via INTRAVENOUS

## 2017-11-17 MED ORDER — HEPARIN (PORCINE) IN NACL 1000-0.9 UT/500ML-% IV SOLN
INTRAVENOUS | Status: AC
  Filled 2017-11-17: qty 1000

## 2017-11-17 MED ORDER — HYDRALAZINE HCL 20 MG/ML IJ SOLN: 5.0000 mg | INTRAMUSCULAR | Status: AC | PRN

## 2017-11-17 MED ORDER — SODIUM CHLORIDE 0.9 % IV SOLN
INTRAVENOUS | Status: DC
  Administered 2017-11-17 – 2017-11-18 (×2): via INTRAVENOUS

## 2017-11-17 MED ORDER — MIDAZOLAM HCL 2 MG/2ML IJ SOLN
INTRAMUSCULAR | Status: DC | PRN
  Administered 2017-11-17: 1 mg via INTRAVENOUS

## 2017-11-17 MED ORDER — SODIUM CHLORIDE 0.9% FLUSH: 3.0000 mL | INTRAVENOUS | Status: DC | PRN

## 2017-11-17 MED ORDER — SODIUM CHLORIDE 0.9 % IV SOLN: 250.0000 mL | INTRAVENOUS | Status: DC | PRN

## 2017-11-17 MED ORDER — CLONAZEPAM 1 MG PO TABS: 1.0000 mg | ORAL_TABLET | Freq: Every day | ORAL | Status: DC

## 2017-11-17 MED ORDER — TIROFIBAN HCL IN NACL 5-0.9 MG/100ML-% IV SOLN
INTRAVENOUS | Status: AC | PRN
  Administered 2017-11-17: 0.075 ug/kg/min via INTRAVENOUS

## 2017-11-17 MED ORDER — LISINOPRIL 5 MG PO TABS
10.0000 mg | ORAL_TABLET | Freq: Every day | ORAL | Status: DC
  Administered 2017-11-18: 10 mg via ORAL
  Filled 2017-11-17: qty 2

## 2017-11-17 MED ORDER — NITROGLYCERIN 5 MG/ML IV SOLN
INTRAVENOUS | Status: AC
  Filled 2017-11-17: qty 10

## 2017-11-17 MED ORDER — TICAGRELOR 90 MG PO TABS
180.0000 mg | ORAL_TABLET | Freq: Once | ORAL | Status: AC
  Administered 2017-11-17: 180 mg via ORAL
  Filled 2017-11-17: qty 2

## 2017-11-17 MED ORDER — TIROFIBAN HCL IN NACL 5-0.9 MG/100ML-% IV SOLN
INTRAVENOUS | Status: AC
  Filled 2017-11-17: qty 100

## 2017-11-17 MED ORDER — FENTANYL CITRATE (PF) 100 MCG/2ML IJ SOLN
INTRAMUSCULAR | Status: AC
  Filled 2017-11-17: qty 2

## 2017-11-17 MED ORDER — HYDROCODONE-ACETAMINOPHEN 5-325 MG PO TABS: 1.0000 | ORAL_TABLET | ORAL | Status: DC | PRN

## 2017-11-17 MED ORDER — ONDANSETRON HCL 4 MG/2ML IJ SOLN: 4.0000 mg | Freq: Four times a day (QID) | INTRAMUSCULAR | Status: DC | PRN

## 2017-11-17 MED ORDER — DOCUSATE SODIUM 100 MG PO CAPS
100.0000 mg | ORAL_CAPSULE | Freq: Two times a day (BID) | ORAL | Status: DC
  Administered 2017-11-17 – 2017-11-18 (×2): 100 mg via ORAL
  Filled 2017-11-17 (×3): qty 1

## 2017-11-17 MED ORDER — ONDANSETRON HCL 4 MG PO TABS: 4.0000 mg | ORAL_TABLET | Freq: Four times a day (QID) | ORAL | Status: DC | PRN

## 2017-11-17 MED ORDER — VERAPAMIL HCL 2.5 MG/ML IV SOLN
INTRAVENOUS | Status: AC
  Filled 2017-11-17: qty 2

## 2017-11-17 MED ORDER — ENOXAPARIN SODIUM 40 MG/0.4ML ~~LOC~~ SOLN
40.0000 mg | SUBCUTANEOUS | Status: DC
  Filled 2017-11-17: qty 0.4

## 2017-11-17 MED ORDER — BISACODYL 5 MG PO TBEC: 5.0000 mg | DELAYED_RELEASE_TABLET | Freq: Every day | ORAL | Status: DC | PRN

## 2017-11-17 MED ORDER — CLONAZEPAM 1 MG PO TABS
1.0000 mg | ORAL_TABLET | Freq: Once | ORAL | Status: AC
  Administered 2017-11-17: 1 mg via ORAL
  Filled 2017-11-17: qty 1

## 2017-11-17 MED ORDER — TICAGRELOR 90 MG PO TABS
90.0000 mg | ORAL_TABLET | Freq: Two times a day (BID) | ORAL | Status: DC
  Administered 2017-11-17 – 2017-11-18 (×3): 90 mg via ORAL
  Filled 2017-11-17 (×5): qty 1

## 2017-11-17 MED ORDER — ACETAMINOPHEN 650 MG RE SUPP: 650.0000 mg | Freq: Four times a day (QID) | RECTAL | Status: DC | PRN

## 2017-11-17 MED ORDER — HEPARIN SODIUM (PORCINE) 1000 UNIT/ML IJ SOLN
INTRAMUSCULAR | Status: AC
  Filled 2017-11-17: qty 1

## 2017-11-17 MED ORDER — ASPIRIN 81 MG PO CHEW
81.0000 mg | CHEWABLE_TABLET | Freq: Every day | ORAL | Status: DC
  Administered 2017-11-18: 81 mg via ORAL
  Filled 2017-11-17: qty 1

## 2017-11-17 MED ORDER — PNEUMOCOCCAL VAC POLYVALENT 25 MCG/0.5ML IJ INJ: 0.5000 mL | INJECTION | INTRAMUSCULAR | Status: DC

## 2017-11-17 MED ORDER — LABETALOL HCL 5 MG/ML IV SOLN: 10.0000 mg | INTRAVENOUS | Status: AC | PRN

## 2017-11-17 MED ORDER — ENOXAPARIN SODIUM 40 MG/0.4ML ~~LOC~~ SOLN: 40.0000 mg | SUBCUTANEOUS | Status: DC

## 2017-11-17 MED ORDER — ACETAMINOPHEN 325 MG PO TABS
650.0000 mg | ORAL_TABLET | Freq: Four times a day (QID) | ORAL | Status: DC | PRN
  Administered 2017-11-17: 650 mg via ORAL

## 2017-11-17 MED ORDER — FENTANYL CITRATE (PF) 100 MCG/2ML IJ SOLN
INTRAMUSCULAR | Status: DC | PRN
  Administered 2017-11-17: 25 ug via INTRAVENOUS

## 2017-11-17 MED ORDER — CARVEDILOL 3.125 MG PO TABS
3.1250 mg | ORAL_TABLET | Freq: Two times a day (BID) | ORAL | Status: DC
  Administered 2017-11-18 – 2017-11-19 (×3): 3.125 mg via ORAL
  Filled 2017-11-17 (×3): qty 1

## 2017-11-17 MED ORDER — ROSUVASTATIN CALCIUM 10 MG PO TABS
20.0000 mg | ORAL_TABLET | Freq: Every day | ORAL | Status: DC
  Administered 2017-11-18: 20 mg via ORAL
  Filled 2017-11-17: qty 2

## 2017-11-17 MED ORDER — HEPARIN (PORCINE) IN NACL 100-0.45 UNIT/ML-% IJ SOLN
700.0000 [IU]/h | INTRAMUSCULAR | Status: DC
  Filled 2017-11-17: qty 250

## 2017-11-17 MED ORDER — INFLUENZA VAC SPLIT QUAD 0.5 ML IM SUSY: 0.5000 mL | PREFILLED_SYRINGE | INTRAMUSCULAR | Status: DC

## 2017-11-17 MED ORDER — NITROGLYCERIN 1 MG/10 ML FOR IR/CATH LAB
INTRA_ARTERIAL | Status: DC | PRN
  Administered 2017-11-17 (×4): 200 ug via INTRACORONARY

## 2017-11-17 MED ORDER — TIROFIBAN (AGGRASTAT) BOLUS VIA INFUSION
INTRAVENOUS | Status: DC | PRN
  Administered 2017-11-17: 1417.5 ug via INTRAVENOUS

## 2017-11-17 MED ORDER — HEPARIN SODIUM (PORCINE) 1000 UNIT/ML IJ SOLN
INTRAMUSCULAR | Status: DC | PRN
  Administered 2017-11-17: 3000 [IU] via INTRAVENOUS
  Administered 2017-11-17: 2000 [IU] via INTRAVENOUS

## 2017-11-17 MED ORDER — MIDAZOLAM HCL 2 MG/2ML IJ SOLN
INTRAMUSCULAR | Status: AC
  Filled 2017-11-17: qty 2

## 2017-11-17 MED ORDER — TRAZODONE HCL 50 MG PO TABS: 25.0000 mg | ORAL_TABLET | Freq: Every evening | ORAL | Status: DC | PRN

## 2017-11-17 MED ORDER — TIROFIBAN HCL IN NACL 5-0.9 MG/100ML-% IV SOLN
0.1500 ug/kg/min | INTRAVENOUS | Status: AC
  Administered 2017-11-17: 0.15 ug/kg/min via INTRAVENOUS
  Filled 2017-11-17: qty 100

## 2017-11-17 MED ORDER — CLONAZEPAM 0.5 MG PO TABS
0.5000 mg | ORAL_TABLET | Freq: Every day | ORAL | Status: DC
  Administered 2017-11-18: 0.5 mg via ORAL
  Filled 2017-11-17: qty 1

## 2017-11-17 MED ORDER — HEPARIN SODIUM (PORCINE) 1000 UNIT/ML IJ SOLN
4000.0000 [IU] | Freq: Once | INTRAMUSCULAR | Status: AC
  Administered 2017-11-17: 4000 [IU] via INTRAVENOUS

## 2017-11-17 SURGICAL SUPPLY — 19 items
BALLN MINITREK RX 2.0X12 (BALLOONS) ×3
BALLN ~~LOC~~ TREK RX 3.0X8 (BALLOONS) ×3
BALLOON MINITREK RX 2.0X12 (BALLOONS) ×1 IMPLANT
BALLOON ~~LOC~~ TREK RX 3.0X8 (BALLOONS) ×1 IMPLANT
CATH INFINITI JR4 5F (CATHETERS) ×3 IMPLANT
CATH VISTA GUIDE 6FR XB3 (CATHETERS) ×3 IMPLANT
DEVICE INFLAT 30 PLUS (MISCELLANEOUS) ×3 IMPLANT
DEVICE RAD TR BAND REGULAR (VASCULAR PRODUCTS) ×3 IMPLANT
GLIDESHEATH SLEND SS 6F .021 (SHEATH) ×3 IMPLANT
KIT MANI 3VAL PERCEP (MISCELLANEOUS) ×3 IMPLANT
PACK CARDIAC CATH (CUSTOM PROCEDURE TRAY) ×3 IMPLANT
STENT RESOLUTE ONYX 2.0X22 (Permanent Stent) ×3 IMPLANT
STENT RESOLUTE ONYX 2.25X38 (Permanent Stent) ×3 IMPLANT
WIRE G HI TQ BMW 190 (WIRE) ×3 IMPLANT
WIRE HI TORQ WHISPER MS 190CM (WIRE) ×3 IMPLANT
WIRE HITORQ VERSACORE ST 145CM (WIRE) ×3 IMPLANT
WIRE ROSEN-J .035X260CM (WIRE) ×3 IMPLANT
WIRE RUNTHROUGH .014X180CM (WIRE) ×3 IMPLANT
WIRE RUNTHROUGH .014X300CM (WIRE) IMPLANT

## 2017-11-17 NOTE — ED Provider Notes (Signed)
[facility] Emergency Department Provider Note   ____________________________________________   I have reviewed the triage vital signs and the nursing notes.   HISTORY  Chief Complaint Code STEMI   History limited by: Not Limited   HPI Nicole Ayala is a 52 y.o. female who presents to the emergency department today via EMS as a code STEMI.  Patient states that she started having chest pain yesterday.  She describes as a pressure-like.  It does radiate to her back.  She is also having left arm pain.  Patient denies any shortness of breath.  She states she has a history of acid reflux.  Upon EMS arrival she was found to be quite hypertensive.  EKG was concerning for acute MI.  Patient was given aspirin Nitropaste and fentanyl.  She states that her pressure at the time of presentation emergency department was improved.   Per medical record review patient has a history of HTN, HLD.  Past Medical History:  Diagnosis Date  . Abnormal EKG   . GERD (gastroesophageal reflux disease)   . Hot flashes   . Hypercholesteremia   . Hypertension   . Kidney stones   . Low back pain   . Rheumatoid arthritis (HCC)   . Vitamin D deficiency     Patient Active Problem List   Diagnosis Date Noted  . Nephrolithiasis 05/26/2015  . Incontinence 05/26/2015  . Gonalgia 05/13/2014  . Encounter for screening for malignant neoplasm of colon 05/13/2014  . Soft tissue lesion of shoulder region 12/05/2011    Past Surgical History:  Procedure Laterality Date  . TONSILLECTOMY    . TUBAL LIGATION      Prior to Admission medications   Medication Sig Start Date End Date Taking? Authorizing Provider  AMLODIPINE-ATORVASTATIN PO Take by mouth.    [provider]  carvedilol (COREG) 25 MG tablet Take by mouth.    [provider]  clonazePAM (KLONOPIN) 0.5 MG tablet Take 0.5 mg by mouth. 12/14/06   [provider]  colchicine 0.6 MG tablet Take 1 tablet  (0.6 mg total) by mouth daily. 05/14/16   Hassan Rowan, MD  cyclobenzaprine (FLEXERIL) 10 MG tablet Take 1 tablet (10 mg total) by mouth at bedtime as needed for muscle spasms. Do not drive while taking as can cause drowsiness 09/02/16   Renford Dills, NP  ibuprofen (ADVIL,MOTRIN) 600 MG tablet Take 1 tablet (600 mg total) by mouth every 8 (eight) hours as needed for mild pain or moderate pain. 10/26/14   Renford Dills, NP  indomethacin (INDOCIN) 25 MG capsule Take 1 capsule (25 mg total) by mouth 2 (two) times daily with a meal. 05/14/16   Hassan Rowan, MD  lisinopril-hydrochlorothiazide [facility]) 20-12.5 MG tablet Take by mouth. 10/16/08   [provider]  omeprazole (PRILOSEC) 40 MG capsule Take 40 mg by mouth daily. Reported on 05/25/2015    [provider]  oxyCODONE-acetaminophen (ROXICET) 5-325 MG tablet Take 1 tablet by mouth every 8 (eight) hours as needed for moderate pain or severe pain (Do not drive or operate heavy machinery while taking as can cause drowsiness.). 09/02/16   Renford Dills, NP  predniSONE (DELTASONE) 10 MG tablet Start 60 mg po day one, then 50 mg po day two, taper by 10 mg daily until complete. 09/02/16   Renford Dills, NP  rosuvastatin (CRESTOR) 20 MG tablet Take 20 mg by mouth daily.    [provider]    Allergies Patient has no known allergies.  Family History  Problem Relation Age of Onset  . Heart failure Mother   . Cancer Father   . Hematuria Neg Hx   . Kidney disease Neg Hx   . Bladder Cancer Neg Hx     Social History Social History   Tobacco Use  . Smoking status: Current Every Day Smoker    Packs/day: 1.00    Types: Cigarettes  . Smokeless tobacco: Never Used  Substance Use Topics  . Alcohol use: No  . Drug use: No    Review of Systems Constitutional: No fever/chills Eyes: No visual changes. ENT: No sore throat. Cardiovascular: Positive for chest pain.  Respiratory: Denies shortness of  breath. Gastrointestinal: No abdominal pain.  No nausea, no vomiting.  No diarrhea.   Genitourinary: Negative for dysuria. Musculoskeletal: Positive for left arm pain.  Skin: Negative for rash. Neurological: Negative for headaches, focal weakness or numbness.  ____________________________________________   PHYSICAL EXAM:  VITAL SIGNS: ED Triage Vitals  Enc Vitals Group     BP 11/17/17 1315 (!) 193/116     Pulse Rate 11/17/17 1315 67     Resp 11/17/17 1316 17     Temp --      Temp src --      SpO2 11/17/17 1315 100 %     Weight 11/17/17 1318 125 lb (56.7 kg)     Height 11/17/17 1318 4\' 11"  (1.499 m)     Head Circumference --      Peak Flow --      Pain Score 11/17/17 1317 8   Constitutional: Alert and oriented.  Eyes: Conjunctivae are normal.  ENT      Head: Normocephalic and atraumatic.      Nose: No congestion/rhinnorhea.      Mouth/Throat: Mucous membranes are moist.      Neck: No stridor. Cardiovascular: Normal rate, regular rhythm.   Respiratory: Normal respiratory effort without tachypnea nor retractions.  Genitourinary: Deferred Musculoskeletal: Normal range of motion in all extremities. No lower extremity edema. Neurologic:  Normal speech and language. No gross focal neurologic deficits are appreciated.  Skin:  Skin is warm, dry and intact. No rash noted. Psychiatric: Mood and affect are normal. Speech and behavior are normal. Patient exhibits appropriate insight and judgment.  ____________________________________________    LABS (pertinent positives/negatives)  Pending  ____________________________________________   EKG  I, 11/19/17, attending physician, personally viewed and interpreted this EKG  EKG Time: 1315 Rate: 63 Rhythm: sinus rhythm Axis: normal Intervals: qtc 406 QRS: narrow ST changes: st elevation v2-v5,  Impression: abnormal ekg, STEMI   ____________________________________________     RADIOLOGY  None  ____________________________________________   PROCEDURES  Procedures  CRITICAL CARE Performed by: Phineas Semen   Total critical care time: 20 minutes  Critical care time was exclusive of separately billable procedures and treating other patients.  Critical care was necessary to treat or prevent imminent or life-threatening deterioration.  Critical care was time spent personally by me on the following activities: development of treatment plan with patient and/or surrogate as well as nursing, discussions with consultants, evaluation of patient's response to treatment, examination of patient, obtaining history from patient or surrogate, ordering and performing treatments and interventions, ordering and review of laboratory studies, ordering and review of radiographic studies, pulse oximetry and re-evaluation of patient's condition.  ____________________________________________   INITIAL IMPRESSION / ASSESSMENT AND PLAN / ED COURSE  Pertinent labs & imaging results that were available during my care of the patient were reviewed by me and  considered in my medical decision making (see chart for details).   Patient presented to the emergency department via EMS as a code STEMI.  EKG here and EKGs performed by EMS are consistent with acute myocardial infarction.  Dr. and with cardiology was present on patient's arrival.  Patient will be taken emergently to the catheterization lab.   ____________________________________________   FINAL CLINICAL IMPRESSION(S) / ED DIAGNOSES  Final diagnoses:  ST elevation myocardial infarction (STEMI), unspecified artery [facility])     Note: This dictation was prepared with Dragon dictation. Any transcriptional errors that result from this process are unintentional     Phineas Semen, MD 11/17/17 1342

## 2017-11-17 NOTE — Consult Note (Signed)
Cardiology Consultation:   Patient ID: ZAKYA HALABI MRN: [id number]; DOB: Mar 28, 1965  Admit date: 11/17/2017 Date of Consult: 11/17/2017  Primary Care Provider: Leanna Sato, MD Primary Cardiologist: Dorothyann Peng, MD Primary Electrophysiologist:  None    Patient Profile:   Nicole Ayala is a 52 y.o. female with a hx of HTN, HLD, kidney stones, and tobacco abuse, who is being seen today for the evaluation of chest pain and abnormal EKG at the request of Dr. Derrill Kay.  History of Present Illness:   Nicole Ayala was in her usual state of health until yesterday afternoon, when she began to experience intermittent central chest and upper back pain.  She took Advil PM and was able to go to sleep.  She felt okay when she first woke up this morning but developed severe (10/10) substernal chest pressure radiating to the left arm with accompanying shortness of breath around lunchtime today.  She called 911 and was found to have anterior ST segment elevation on EKG, as well as marked blood pressure elevation.  She was given ASA, NTG paste, and fentanyl with improvement in pain (3/10 upon arrival in ED).  Repeat EKG showed continued anterior ST segment elevation.  She was given heparin and ticagrelor in the ED and then taken for emergent LHC.  Cath showed diffuse disease in the proximal and mid LAD of up to 99% as well as high-grade disease involving D1.  The patient underwent successful PCI to the LAD with 2 overlapping drug-eluting stents.  Angioplasty of D1 was performed before the vessel was jailed with the proximal LAD stent.  Final angiogram showed 95% ostial D1 stenosis with TIMI-3 flow.  Attempts to rewire D1 were unsuccessful.  Medical management was recommended given resolution of chest pain and TIMI-3 flow.  Currently, the patient feels well other than pain at the TR band site on her right forearm.  Past Medical History:  Diagnosis Date  . Abnormal EKG   . GERD (gastroesophageal reflux  disease)   . Hot flashes   . Hypercholesteremia   . Hypertension   . Kidney stones   . Low back pain   . Rheumatoid arthritis (HCC)   . Vitamin D deficiency     Past Surgical History:  Procedure Laterality Date  . TONSILLECTOMY    . TUBAL LIGATION       Home Medications:  Prior to Admission medications   Medication Sig Start Date Nicole Ayala Date Taking? Authorizing Provider  AMLODIPINE-ATORVASTATIN PO Take by mouth.    [provider]  carvedilol (COREG) 25 MG tablet Take by mouth.    [provider]  clonazePAM (KLONOPIN) 0.5 MG tablet Take 0.5 mg by mouth. 12/14/06   [provider]  colchicine 0.6 MG tablet Take 1 tablet (0.6 mg total) by mouth daily. 05/14/16   Hassan Rowan, MD  indomethacin (INDOCIN) 25 MG capsule Take 1 capsule (25 mg total) by mouth 2 (two) times daily with a meal. 05/14/16   Hassan Rowan, MD  lisinopril-hydrochlorothiazide [facility]) 20-12.5 MG tablet Take by mouth. 10/16/08   [provider]  omeprazole (PRILOSEC) 40 MG capsule Take 40 mg by mouth daily. Reported on 05/25/2015    [provider]  rosuvastatin (CRESTOR) 20 MG tablet Take 20 mg by mouth daily.    [provider]    Inpatient Medications: Scheduled Meds: . [START ON 11/18/2017] aspirin  81 mg Oral Daily  . carvedilol  3.125 mg Oral BID WC  . clonazePAM  0.5 mg  Oral Daily  . docusate sodium  100 mg Oral BID  . [START ON 11/18/2017] enoxaparin (LOVENOX) injection  40 mg Subcutaneous Q24H  . lisinopril  10 mg Oral Daily  . [START ON 11/18/2017] rosuvastatin  20 mg Oral Daily  . sodium chloride flush  3 mL Intravenous Q12H  . ticagrelor  90 mg Oral BID   Continuous Infusions: . sodium chloride    . sodium chloride 75 mL/hr at 11/17/17 1553   PRN Meds: sodium chloride, acetaminophen **OR** acetaminophen, acetaminophen, bisacodyl, hydrALAZINE, HYDROcodone-acetaminophen, labetalol, ondansetron **OR** ondansetron (ZOFRAN) IV, sodium  chloride flush, traZODone  Allergies:   No Known Allergies  Social History:   Social History   Tobacco Use  . Smoking status: Current Every Day Smoker    Packs/day: 1.00    Types: Cigarettes  . Smokeless tobacco: Never Used  Substance Use Topics  . Alcohol use: No  . Drug use: No    Family History:   Family History  Problem Relation Age of Onset  . Heart failure Mother   . Cancer Father   . Hematuria Neg Hx   . Kidney disease Neg Hx   . Bladder Cancer Neg Hx      ROS:  Review of Systems  Unable to perform ROS: Acuity of condition   Physical Exam/Data:   Vitals:   11/17/17 1329 11/17/17 1330 11/17/17 1351 11/17/17 1527  BP:  (!) 161/103  140/86  Pulse: 64 68  (!) 57  Resp: 16 10  20   Temp:    (!) 96.5 F (35.8 C)  TempSrc:    Axillary  SpO2: 100% 97% 98% 97%  Weight:    61.3 kg  Height:    4\' 11"  (1.499 m)   No intake or output data in the 24 hours ending 11/17/17 1556 Filed Weights   11/17/17 1318 11/17/17 1527  Weight: 56.7 kg 61.3 kg   Body mass index is 27.3 kg/m.  General:  Well nourished, well developed, in no acute distress. HEENT: normal Lymph: no adenopathy Neck: no JVD Endocrine:  No thryomegaly Vascular: No carotid bruits; FA pulses 2+ bilaterally without bruits  Cardiac:  normal S1, S2; RRR; no murmur. Lungs:  clear to auscultation bilaterally, no wheezing, rhonchi or rales  Abd: soft, nontender, no hepatomegaly  Ext: no edema Musculoskeletal:  No deformities, BUE and BLE strength normal and equal Skin: warm and dry  Neuro:  CNs 2-12 intact, no focal abnormalities noted Psych:  Normal affect   EKG:  The EKG (pre-PCI) was personally reviewed and demonstrates:  NSR with anterior and subtle inferior ST segment elevation.  Relevant CV Studies: LHC/PCI (11/17/17): 1. Three vessel CAD, with diffuse, severe disease involving the proximal and mid LAD, as well as D1, with up to 99% stenosis of the mid LAD.  Small LCx and dominant RCA are  moderately diseased with up to 60% stenosis. 2. Moderately to severely reduced LVEF (35-40%) with mid and apical anterior and apical inferior hypokinesis/akinesis. 3. Moderately elevated left ventricular filling pressure (LVEDP ~25 mmHg). 4. Successful PCI to proximal/mid LAD with placement of two overlapping Resolute Onyx drug-eluting stents with 0% residual stenosis and TIMI-3 flow.  Ostial D1 stenosis was gently angioplastied with a 2.0 mm balloon but showed 95% stenosis after being jailed by LAD stent.  Attempts to rewire the branch were unsuccessful.  Given lack of chest pain and TIMI-3 flow in D1, medical therapy is recommended.  Diagnostic Diagram       Post-Intervention Diagram  Laboratory Data:  ChemistryNo results for input(s): NA, K, CL, CO2, GLUCOSE, BUN, CREATININE, CALCIUM, GFRNONAA, GFRAA, ANIONGAP in the last 168 hours.  No results for input(s): PROT, ALBUMIN, AST, ALT, ALKPHOS, BILITOT in the last 168 hours. Hematology Recent Labs  Lab 11/17/17 1318  WBC 6.5  RBC 4.66  HGB 15.1  HCT 43.5  MCV 93.4  MCH 32.4  MCHC 34.7  RDW 13.0  PLT 239   Cardiac EnzymesNo results for input(s): TROPONINI in the last 168 hours. No results for input(s): TROPIPOC in the last 168 hours.  BNPNo results for input(s): BNP, PROBNP in the last 168 hours.  DDimer No results for input(s): DDIMER in the last 168 hours.  Radiology/Studies:  No results found.  Assessment and Plan:   Anterior STEMI MI due to likely plaque rupture and thrombotic occlusion of diffusely diseased mid LAD, with spontaneous recanalization with medical therapy in ED.  Patient underwent successful PCI to proximal and mid LAD.  Residual disease in D1, RCA, and LCx will be managed medically.  Continue tirofiban x 4 hours.  DAPT with ASA and ticagrelor for at least 12 months.  Continue rosuvastatin 20 mg daily; will need to be increased if LDL > 70.  Trend troponin until it has peaked, then  stop.  Smoking cessation.  Cardiac rehab after discharge.  Ischemic cardiomyopathy LVEF moderately to severely decreased on LVgram.  LVEDP also moderately elevated.  Decrease carvedilol to 3.125 mg BID, given bradycardia.  Continue lisinopril 10 mg daily; further uptitration as BP allows.  Maintain euvolemia today, given contrast load.  Consider gentle diuresis beginning tomorrow (sooner if patient develops shortness of breath).  Follow-up echo.  Hypertension  Carvedilol and lisinopril.  Hyperlipidemia  Rosuvastatin 20 mg daily.  Check fasting lipid panel in AM.  Disposition  Admit to ICU for at least 24 hours of monitoring, followed by at least 24 hours of monitoring on telemetry.  As the patient follows with Dr. Juliann Pares, [facility] will assume the patient's cardiology care tomorrow morning (Dr. Gwen Pounds aware).    For questions or updates, please contact [facility] Please consult [email or web address] for contact info under [facility].  Signed, Yvonne Kendall, MD  11/17/2017 3:56 PM

## 2017-11-17 NOTE — Progress Notes (Signed)
ANTICOAGULATION CONSULT NOTE - Initial Consult  Pharmacy Consult for Tirofiban  Indication: S/P PCI   No Known Allergies  Patient Measurements: Height: 4\' 11"  (149.9 cm) Weight: 135 lb 2.3 oz (61.3 kg) IBW/kg (Calculated) : 43.2 Heparin Dosing Weight:   Vital Signs: Temp: 96.5 F (35.8 C) (09/28 1527) Temp Source: Axillary (09/28 1527) BP: 140/86 (09/28 1527) Pulse Rate: 57 (09/28 1527)  Labs: Recent Labs    11/17/17 1318  HGB 15.1  HCT 43.5  PLT 239  APTT 29  LABPROT 11.3*  INR 0.83    CrCl cannot be calculated (Patient's most recent lab result is older than the maximum 21 days allowed.).   Medical History: Past Medical History:  Diagnosis Date  . Abnormal EKG   . GERD (gastroesophageal reflux disease)   . Hot flashes   . Hypercholesteremia   . Hypertension   . Kidney stones   . Low back pain   . Rheumatoid arthritis (HCC)   . Vitamin D deficiency     Medications:  Medications Prior to Admission  Medication Sig Dispense Refill Last Dose  . AMLODIPINE-ATORVASTATIN PO Take by mouth.   09/02/2016 at Unknown time  . carvedilol (COREG) 25 MG tablet Take by mouth.   09/02/2016 at Unknown time  . clonazePAM (KLONOPIN) 0.5 MG tablet Take 0.5 mg by mouth.   09/02/2016 at Unknown time  . colchicine 0.6 MG tablet Take 1 tablet (0.6 mg total) by mouth daily. 14 tablet 0 More than a month at Unknown time  . indomethacin (INDOCIN) 25 MG capsule Take 1 capsule (25 mg total) by mouth 2 (two) times daily with a meal. 30 capsule 0 More than a month at Unknown time  . lisinopril-hydrochlorothiazide (PRINZIDE,ZESTORETIC) 20-12.5 MG tablet Take by mouth.   09/02/2016 at Unknown time  . omeprazole (PRILOSEC) 40 MG capsule Take 40 mg by mouth daily. Reported on 05/25/2015   09/02/2016 at Unknown time  . rosuvastatin (CRESTOR) 20 MG tablet Take 20 mg by mouth daily.   09/02/2016 at Unknown time    Assessment: Pharmacy consulted to dose tirofiban in this 52 year old female s/p PCI.    Pt finished PCI around 1500.  Current renal function not available but past labs show no renal impairment .   Spoke with Dr End, he is ok with continuing drip dosed for normal renal function.   Goal of Therapy:  prevention of post PCI thrombosis.    Plan:  Will increase Tirofiban to 0.15 mcg/kg/min (11 ml/hr) to continue until 4 hrs post PCI (1900 on 9/28).  Spoke with RN and informed her to increase drip rate.   Kayden Hutmacher D 11/17/2017,4:16 PM

## 2017-11-17 NOTE — Progress Notes (Signed)
   11/17/17 1830  Clinical Encounter Type  Visited With Patient  Visit Type Follow-up;Spiritual support;Post-op;Other (Comment)  Referral From Nurse  Consult/Referral To Chaplain  Spiritual Encounters  Spiritual Needs Prayer;Emotional   [facility] received a OR to follow up with a patient that had come in earlier in the day for chest pain. The patient had a heart attach and received a heart cath and stents. The patient was tired but thankful for the relief. She wanted me to pray for her which I did. I will follow up as needed.

## 2017-11-17 NOTE — ED Triage Notes (Signed)
Pt arrives ems from home stemi called in field cardiologist at bedside. Pt able to speak in full sentences at this time.

## 2017-11-17 NOTE — H&P (Signed)
[facility] - [place] at [facility]   PATIENT NAME: Nicole Ayala    MR#:  [id number]  DATE OF BIRTH:  Apr 04, 1965  DATE OF ADMISSION:  11/17/2017  PRIMARY CARE PHYSICIAN: Leanna Sato, MD   REQUESTING/REFERRING PHYSICIAN: Dr. Okey Dupre  CHIEF COMPLAINT: Chest pain, ST elevation MI   Chief Complaint  Patient presents with  . Code STEMI    HISTORY OF PRESENT ILLNESS:  Nicole Ayala  is a 52 y.o. female with a known history of essential hypertension, gout has been having gas-like pain on the right since yesterday and she also felt felt lots of fatigue yesterday and thought it to gas pain into gas medicines and slept this morning she had crushing chest pain associated with diaphoresis so she came to the ER found to have ST elevations in lead V2 to V5, code STEMI pathway activated, had emergency cardiac cath with diffuse severe disease involving proximal, mid LAD 97% stenosis of mid LAD.  Patient had successful IV stent to proximal mid LAD with placement of 2 stents.  Patient is admitted to intensive care unit for close monitoring.  She complains of pain at the radial cath site.  PAST MEDICAL HISTORY:   Past Medical History:  Diagnosis Date  . Abnormal EKG   . GERD (gastroesophageal reflux disease)   . Hot flashes   . Hypercholesteremia   . Hypertension   . Kidney stones   . Low back pain   . Rheumatoid arthritis (HCC)   . Vitamin D deficiency     PAST SURGICAL HISTOIRY:   Past Surgical History:  Procedure Laterality Date  . TONSILLECTOMY    . TUBAL LIGATION      SOCIAL HISTORY:   Social History   Tobacco Use  . Smoking status: Current Every Day Smoker    Packs/day: 1.00    Types: Cigarettes  . Smokeless tobacco: Never Used  Substance Use Topics  . Alcohol use: No    FAMILY HISTORY:   Family History  Problem Relation Age of Onset  . Heart failure Mother   . Cancer Father   . Hematuria Neg Hx   . Kidney disease Neg Hx   . Bladder Cancer Neg  Hx     DRUG ALLERGIES:  No Known Allergies  REVIEW OF SYSTEMS:  CONSTITUTIONAL: Fatigue, since yesterday.  EYES: No blurred or double vision.  EARS, NOSE, AND THROAT: No tinnitus or ear pain.  RESPIRATORY: No cough, shortness of breath, wheezing or hemoptysis.  CARDIOVASCULAR: No chest pain, orthopnea, edema.  GASTROINTESTINAL: No nausea, vomiting, diarrhea or abdominal pain.  GENITOURINARY: No dysuria, hematuria.  ENDOCRINE: No polyuria, nocturia,  HEMATOLOGY: No anemia, easy bruising or bleeding SKIN: No rash or lesion. MUSCULOSKELETAL: No joint pain or arthritis.   NEUROLOGIC: No tingling, numbness, weakness.  PSYCHIATRY: No anxiety or depression.   MEDICATIONS AT HOME:   Prior to Admission medications   Medication Sig Start Date End Date Taking? Authorizing Provider  AMLODIPINE-ATORVASTATIN PO Take by mouth.    [provider]  carvedilol (COREG) 25 MG tablet Take by mouth.    [provider]  clonazePAM (KLONOPIN) 0.5 MG tablet Take 0.5 mg by mouth. 12/14/06   [provider]  colchicine 0.6 MG tablet Take 1 tablet (0.6 mg total) by mouth daily. 05/14/16   Hassan Rowan, MD  indomethacin (INDOCIN) 25 MG capsule Take 1 capsule (25 mg total) by mouth 2 (two) times daily with a meal. 05/14/16   Hassan Rowan, MD  lisinopril-hydrochlorothiazide (PRINZIDE,ZESTORETIC) 20-12.5 MG tablet Take by mouth. 10/16/08   [provider]  omeprazole (PRILOSEC) 40 MG capsule Take 40 mg by mouth daily. Reported on 05/25/2015    [provider]  rosuvastatin (CRESTOR) 20 MG tablet Take 20 mg by mouth daily.    [provider]      VITAL SIGNS:  Blood pressure 140/86, pulse (!) 57, temperature (!) 96.5 F (35.8 C), temperature source Axillary, resp. rate 20, height 4\' 11"  (1.499 m), weight 61.3 kg, SpO2 97 %.  PHYSICAL EXAMINATION:  GENERAL:  52 y.o.-year-old patient lying in the bed with no acute distress.  EYES: Pupils equal, round, reactive  to light and accommodation. No scleral icterus. Extraocular muscles intact.  HEENT: Head atraumatic, normocephalic. Oropharynx and nasopharynx clear.  NECK:  Supple, no jugular venous distention. No thyroid enlargement, no tenderness.  LUNGS: Normal breath sounds bilaterally, no wheezing, rales,rhonchi or crepitation. No use of accessory muscles of respiration.  CARDIOVASCULAR: S1, S2 normal. No murmurs, rubs, or gallops.  ABDOMEN: Soft, nontender, nondistended. Bowel sounds present. No organomegaly or mass.  EXTREMITIES: No pedal edema, cyanosis, or clubbing.  NEUROLOGIC: Cranial nerves II through XII are intact. Muscle strength 5/5 in all extremities. Sensation intact. Gait not checked.  PSYCHIATRIC: The patient is alert and oriented x 3.  SKIN: No obvious rash, lesion, or ulcer.   LABORATORY PANEL:   CBC Recent Labs  Lab 11/17/17 1318  WBC 6.5  HGB 15.1  HCT 43.5  PLT 239   ------------------------------------------------------------------------------------------------------------------  Chemistries  No results for input(s): NA, K, CL, CO2, GLUCOSE, BUN, CREATININE, CALCIUM, MG, AST, ALT, ALKPHOS, BILITOT in the last 168 hours.  Invalid input(s): GFRCGP ------------------------------------------------------------------------------------------------------------------  Cardiac Enzymes No results for input(s): TROPONINI in the last 168 hours. ------------------------------------------------------------------------------------------------------------------  RADIOLOGY:  No results found.  EKG:   Orders placed or performed during the hospital encounter of 11/17/17  . EKG 12-Lead  . EKG 12-Lead  . EKG 12-Lead immediately post procedure  . EKG 12-Lead  . EKG 12-Lead immediately post procedure    IMPRESSION AND PLAN:   52 year old female with past evaluation of metastatic post PCI, stenting in proximal, mid LAD with drug-eluting stent, patient has three-vessel disease;' so  cardiologist recommended aggressive secondary prevention with Coreg, lisinopril, high intensity statins, continue Aggrastat drip for another 4 hours, monitor in ICU tonight for any arrhythmias, likely transfer to telemetry tomorrow, repeat echocardiogram is ordered.  #2 anxiety: Continue Klonopin 0.5 mg at night. 3.  Essential hypertension, uncontrolled: Patient is on multiple pain medication including lisinopril, Coreg, IV hydralazine.  Obtain basic labs with Chem-7, titrate IV hydration, advance diet to regular diet today.  All the records are reviewed and case discussed with ED provider. Management plans discussed with the patient, family and they are in agreement.  CODE STATUS: Full code  TOTAL TIME TAKING CARE OF THIS PATIENT: 55 minutes.    40 M.D on 11/17/2017 at 3:53 PM  Between 7am to 6pm - Pager - [phone]  After 6pm go to [email or web address] - password [email or web address] [facility]  [place]  Office  [phone]  CC: Primary care physician; [phone], MD  Note: This dictation was prepared with Dragon dictation along with smaller phrase technology. Any transcriptional errors that result from this process are unintentional.

## 2017-11-17 NOTE — ED Notes (Signed)
Pt being transported to cath lab at this time  

## 2017-11-17 NOTE — ED Notes (Signed)
Cardiologist at bedside.  

## 2017-11-17 NOTE — ED Notes (Addendum)
Heparin given this time 4000 units

## 2017-11-17 NOTE — Consult Note (Signed)
[facility] Card[facility] Consultation Note  Patient ID: Nicole Ayala, MRN: [id number], DOB/AGE: 1965/04/11 52 y.o. Admit date: 11/17/2017   Date of Consult: 11/17/2017 Primary Physician: Leanna Sato, MD Primary Cardiologist: Call would  Chief Complaint:  Chief Complaint  Patient presents with  . Code STEMI   Reason for Consult: Myocardial infarction  HPI: 52 y.o. female with known essential hypertension mixed hyperlipidemia on appropriate treatment in the past including carvedilol lisinopril combination for hypertension control.  She also has been on high intensity cholesterol therapy.  The patient has had some shortness of breath over the last month and a half with increasing in frequency and intensity.  Upon admission to the hospital today she claims that she is having episodes of severe substernal chest discomfort radiating into her back with EKG changes consistent with ST elevation myocardial infarction.  Patient was taken to cart cardiac catheterization lab showing a 60% stenosis of the right coronary artery and 99% stenosis of mid left anterior descending artery.  Patient underwent PCI and stent placement without complication and relieved her symptoms.  At this time she is hemodynamically stable without evidence of chest pain or other significant symptoms at this time.  Past Medical History:  Diagnosis Date  . Abnormal EKG   . GERD (gastroesophageal reflux disease)   . Hot flashes   . Hypercholesteremia   . Hypertension   . Kidney stones   . Low back pain   . Rheumatoid arthritis (HCC)   . Vitamin D deficiency       Surgical History:  Past Surgical History:  Procedure Laterality Date  . TONSILLECTOMY    . TUBAL LIGATION       Home Meds: Prior to Admission medications   Medication Sig Start Date End Date Taking? Authorizing Provider  AMLODIPINE-ATORVASTATIN PO Take by mouth.    [provider]  carvedilol (COREG) 25 MG tablet Take by mouth.    [provider]  clonazePAM (KLONOPIN) 0.5 MG tablet Take 0.5 mg by mouth. 12/14/06   [provider]  colchicine 0.6 MG tablet Take 1 tablet (0.6 mg total) by mouth daily. 05/14/16   Hassan Rowan, MD  indomethacin (INDOCIN) 25 MG capsule Take 1 capsule (25 mg total) by mouth 2 (two) times daily with a meal. 05/14/16   Hassan Rowan, MD  lisinopril-hydrochlorothiazide [facility]) 20-12.5 MG tablet Take by mouth. 10/16/08   [provider]  omeprazole (PRILOSEC) 40 MG capsule Take 40 mg by mouth daily. Reported on 05/25/2015    [provider]  rosuvastatin (CRESTOR) 20 MG tablet Take 20 mg by mouth daily.    [provider]    Inpatient Medications:  . [START ON 11/18/2017] aspirin  81 mg Oral Daily  . carvedilol  3.125 mg Oral BID WC  . clonazePAM  0.5 mg Oral Daily  . docusate sodium  100 mg Oral BID  . [START ON 11/18/2017] enoxaparin (LOVENOX) injection  40 mg Subcutaneous Q24H  . lisinopril  10 mg Oral Daily  . [START ON 11/18/2017] rosuvastatin  20 mg Oral Daily  . sodium chloride flush  3 mL Intravenous Q12H  . ticagrelor  90 mg Oral BID   . sodium chloride    . sodium chloride 75 mL/hr at 11/17/17 1553    Allergies: No Known Allergies  Social History   Socioeconomic History  . Marital status: Legally Separated    Spouse name: Not on file  . Number of children: Not on file  .  Years of education: Not on file  . Highest education level: Not on file  Occupational History  . Not on file  Social Needs  . Financial resource strain: Not on file  . Food insecurity:    Worry: Not on file    Inability: Not on file  . Transportation needs:    Medical: Not on file    Non-medical: Not on file  Tobacco Use  . Smoking status: Current Every Day Smoker    Packs/day: 1.00    Types: Cigarettes  . Smokeless tobacco: Never Used  Substance and Sexual Activity  . Alcohol use: No  . Drug use: No  . Sexual activity: Not on file  Lifestyle  .  Physical activity:    Days per week: Not on file    Minutes per session: Not on file  . Stress: Not on file  Relationships  . Social connections:    Talks on phone: Not on file    Gets together: Not on file    Attends religious service: Not on file    Active member of club or organization: Not on file    Attends meetings of clubs or organizations: Not on file    Relationship status: Not on file  . Intimate partner violence:    Fear of current or ex partner: Not on file    Emotionally abused: Not on file    Physically abused: Not on file    Forced sexual activity: Not on file  Other Topics Concern  . Not on file  Social History Narrative  . Not on file     Family History  Problem Relation Age of Onset  . Heart failure Mother   . Cancer Father   . Hematuria Neg Hx   . Kidney disease Neg Hx   . Bladder Cancer Neg Hx      Review of Systems Positive for pain shortness of breath Negative for: General:  chills, fever, night sweats or weight changes.  Cardiovascular: PND orthopnea syncope dizziness  Dermatological skin lesions rashes Respiratory: Cough congestion Urologic: Frequent urination urination at night and hematuria Abdominal: negative for nausea, vomiting, diarrhea, bright red blood per rectum, melena, or hematemesis Neurologic: negative for visual changes, and/or hearing changes  All other systems reviewed and are otherwise negative except as noted above.  Labs: No results for input(s): CKTOTAL, CKMB, TROPONINI in the last 72 hours. Lab Results  Component Value Date   WBC 6.5 11/17/2017   HGB 15.1 11/17/2017   HCT 43.5 11/17/2017   MCV 93.4 11/17/2017   PLT 239 11/17/2017   No results for input(s): NA, K, CL, CO2, BUN, CREATININE, CALCIUM, PROT, BILITOT, ALKPHOS, ALT, AST, GLUCOSE in the last 168 hours.  Invalid input(s): LABALBU No results found for: CHOL, HDL, LDLCALC, TRIG No results found for: DDIMER  Radiology/Studies:  No results found.  EKG:  Normal sinus rhythm with anterior myocardial infarction  Weights: Filed Weights   11/17/17 1318 11/17/17 1527  Weight: 56.7 kg 61.3 kg     Physical Exam: Blood pressure 140/86, pulse (!) 57, temperature (!) 96.5 F (35.8 C), temperature source Axillary, resp. rate 20, height 4\' 11"  (1.499 m), weight 61.3 kg, SpO2 97 %. Body mass index is 27.3 kg/m. General: Well developed, well nourished, in no acute distress. Head eyes ears nose throat: Normocephalic, atraumatic, sclera non-icteric, no xanthomas, nares are without discharge. No apparent thyromegaly and/or mass  Lungs: Normal respiratory effort.  no wheezes, no rales, no rhonchi.  Heart: RRR  with normal S1 S2. no murmur gallop, no rub, PMI is normal size and placement, carotid upstroke normal without bruit, jugular venous pressure is normal Abdomen: Soft, non-tender, non-distended with normoactive bowel sounds. No hepatomegaly. No rebound/guarding. No obvious abdominal masses. Abdominal aorta is normal size without bruit Extremities: No edema. no cyanosis, no clubbing, no ulcers  Peripheral : 2+ bilateral upper extremity pulses, 2+ bilateral femoral pulses, 2+ bilateral dorsal pedal pulse Neuro: Alert and oriented. No facial asymmetry. No focal deficit. Moves all extremities spontaneously. Musculoskeletal: Normal muscle tone without kyphosis Psych:  Responds to questions appropriately with a normal affect.    Assessment: 52 year old female with hypertension hyperlipidemia having an acute anterior myocardial infarction status post PCI and stent placement currently without evidence of congestive heart failure  Plan: 1.  Continue dual antiplatelet therapy status post PCI and stent placement of left anterior descending arteries and myocardial infarction 2.  Beta-blocker and ACE inhibitor for myocardial infarction 3.  High intensity cholesterol therapy 4.  Continue rehabilitation 5.  Echocardiogram for LV systolic dysfunction status post  anterior myocardial infarction 6.  Begin ambulation and follow for improvements and possible discharge home after full recovery  Signed, Lamar Blinks M.D. [facility] Cardiology 11/17/2017, 3:55 PM

## 2017-11-17 NOTE — Progress Notes (Signed)
   11/17/17 1305  Clinical Encounter Type  Visited With Patient  Visit Type Initial;Spiritual support;ED  Referral From Nurse  Consult/Referral To Chaplain  Spiritual Encounters  Spiritual Needs Prayer;Emotional   [facility] received a Code Stemi page for emergency traffic. The patient was alert and the staff was waiting and prepared for Nicole Ayala when she arrived. It was reported that she had a son who she lives with. I went to the waiting area and did not find the son or any other family for Nicole Ayala. I returned to the patient's room and prayed silently outside of her room while the care team prepared her for the Cath lab. I will follow up as needed.

## 2017-11-17 NOTE — ED Notes (Addendum)
Ems administered 1.5" nitro paste, 324 Asprin, 100 mcg fentanyl in rought

## 2017-11-17 NOTE — ED Notes (Signed)
Pt sitting up alert and talking, does not appear diaphoretic at this time

## 2017-11-17 NOTE — Consult Note (Signed)
ANTICOAGULATION CONSULT NOTE - Initial Consult  Pharmacy Consult for Heparin Drip  Indication: chest pain/ACS  No Known Allergies  Patient Measurements: Height: 4\' 11"  (149.9 cm) Weight: 125 lb (56.7 kg) IBW/kg (Calculated) : 43.2  Vital Signs: BP: 161/103 (09/28 1330) Pulse Rate: 68 (09/28 1330)  Labs: Recent Labs    11/17/17 1318  HGB 15.1  HCT 43.5  PLT 239    CrCl cannot be calculated (Patient's most recent lab result is older than the maximum 21 days allowed.).    Assessment: Pharmacy consulted for heparin drip dosing and monitoring in 52 yo female admitted with ACS/STEMI.   Goal of Therapy:  Heparin level 0.3-0.7 units/ml Monitor platelets by anticoagulation protocol: Yes   Plan:  Baseline labs ordered 4000 units bolus x 1 given in ED prior to labs Start heparin infusion at 700 units/hr Check anti-Xa level in 6 hours and daily while on heparin Continue to monitor H&H and platelets  40, PharmD, BCPS Clinical Pharmacist 11/17/2017 1:46 PM

## 2017-11-18 LAB — LIPID PANEL
Cholesterol: 158 mg/dL (ref 0–200)
HDL: 46 mg/dL (ref >40)
LDL Cholesterol: 89 mg/dL (ref 0–99)
Total CHOL/HDL Ratio: 3.4 RATIO
Triglycerides: 115 mg/dL (ref <150)
VLDL: 23 mg/dL (ref 0–40)

## 2017-11-18 LAB — BASIC METABOLIC PANEL
Anion gap: 6 (ref 5–15)
BUN: 19 mg/dL (ref 6–20)
CHLORIDE: 104 mmol/L (ref 98–111)
CO2: 29 mmol/L (ref 22–32)
CREATININE: 0.56 mg/dL (ref 0.44–1.00)
Calcium: 9.2 mg/dL (ref 8.9–10.3)
GFR calc Af Amer: >60 mL/min (ref >60)
GFR calc non Af Amer: >60 mL/min (ref >60)
Glucose, Bld: 110 mg/dL — ABNORMAL HIGH (ref 70–99)
Potassium: 3.5 mmol/L (ref 3.5–5.1)
SODIUM: 139 mmol/L (ref 135–145)

## 2017-11-18 LAB — ECHOCARDIOGRAM COMPLETE
Height: 59 in
Weight: 2162.27 oz

## 2017-11-18 LAB — TROPONIN I
TROPONIN I: 6.2 ng/mL — AB (ref <0.03)
Troponin I: 6.03 ng/mL (ref <0.03)

## 2017-11-18 LAB — CBC
HCT: 39.4 % (ref 35.0–47.0)
Hemoglobin: 14 g/dL (ref 12.0–16.0)
MCH: 33.1 pg (ref 26.0–34.0)
MCHC: 35.5 g/dL (ref 32.0–36.0)
MCV: 93.1 fL (ref 80.0–100.0)
PLATELETS: 214 10*3/uL (ref 150–440)
RBC: 4.23 MIL/uL (ref 3.80–5.20)
RDW: 13.2 % (ref 11.5–14.5)
WBC: 9.1 10*3/uL (ref 3.6–11.0)

## 2017-11-18 LAB — GLUCOSE, CAPILLARY: Glucose-Capillary: 93 mg/dL (ref 70–99)

## 2017-11-18 NOTE — Progress Notes (Signed)
[facility] Cardiology [facility] Encounter Note  Patient: Nicole Ayala / Admit Date: 11/17/2017 / Date of Encounter: 11/18/2017, 1:42 PM   Subjective: Patient is significantly improved at this time with no further episodes of chest discomfort after PCI and stent placement of culprit artery from ST elevation myocardial infarction.  Dynamically stable.  Evidence of significant rhythm disturbances  Review of Systems: Positive for: None Negative for: Vision change, hearing change, syncope, dizziness, nausea, vomiting,diarrhea, bloody stool, stomach pain, cough, congestion, diaphoresis, urinary frequency, urinary pain,skin lesions, skin rashes Others previously listed  Objective: Telemetry: Normal sinus rhythm Physical Exam: Blood pressure 119/71, pulse 72, temperature 98.2 F (36.8 C), temperature source Oral, resp. rate 17, height 4\' 11"  (1.499 m), weight 61.3 kg, SpO2 98 %. Body mass index is 27.3 kg/m. General: Well developed, well nourished, in no acute distress. Head: Normocephalic, atraumatic, sclera non-icteric, no xanthomas, nares are without discharge. Neck: No apparent masses Lungs: Normal respirations with no wheezes, no rhonchi, no rales , no crackles   Heart: Regular rate and rhythm, normal S1 S2, no murmur, no rub, no gallop, PMI is normal size and placement, carotid upstroke normal without bruit, jugular venous pressure normal Abdomen: Soft, non-tender, non-distended with normoactive bowel sounds. No hepatosplenomegaly. Abdominal aorta is normal size without bruit Extremities: No edema, no clubbing, no cyanosis, no ulcers,  Peripheral: 2+ radial, 2+ femoral, 2+ dorsal pedal pulses Neuro: Alert and oriented. Moves all extremities spontaneously. Psych:  Responds to questions appropriately with a normal affect.   Intake/Output Summary (Last 24 hours) at 11/18/2017 1342 Last data filed at 11/18/2017 1200 Gross per 24 hour  Intake 1837.57 ml  Output -  Net 1837.57 ml     Inpatient Medications:  . aspirin  81 mg Oral Daily  . carvedilol  3.125 mg Oral BID WC  . clonazePAM  0.5 mg Oral Daily  . docusate sodium  100 mg Oral BID  . enoxaparin (LOVENOX) injection  40 mg Subcutaneous Q24H  . Influenza vac split quadrivalent PF  0.5 mL Intramuscular Tomorrow-1000  . lisinopril  10 mg Oral Daily  . pneumococcal 23 valent vaccine  0.5 mL Intramuscular Tomorrow-1000  . rosuvastatin  20 mg Oral Daily  . sodium chloride flush  3 mL Intravenous Q12H  . ticagrelor  90 mg Oral BID   Infusions:  . sodium chloride    . sodium chloride 75 mL/hr at 11/18/17 0455    Labs: Recent Labs    11/17/17 1800 11/18/17 0000  NA 139 139  K 3.6 3.5  CL 103 104  CO2 27 29  GLUCOSE 81 110*  BUN 21* 19  CREATININE 0.57 0.56  CALCIUM 8.9 9.2   Recent Labs    11/17/17 1621  AST 36  ALT 26  ALKPHOS 76  BILITOT 0.4  PROT 6.1*  ALBUMIN 3.4*   Recent Labs    11/17/17 1318 11/18/17 0000  WBC 6.5 9.1  HGB 15.1 14.0  HCT 43.5 39.4  MCV 93.4 93.1  PLT 239 214   Recent Labs    11/17/17 1800 11/18/17 0000 11/18/17 0516  TROPONINI 2.17* 6.03* 6.20*   Invalid input(s): POCBNP No results for input(s): HGBA1C in the last 72 hours.   Weights: Filed Weights   11/17/17 1318 11/17/17 1527  Weight: 56.7 kg 61.3 kg     Radiology/Studies:  No results found.   Assessment and Recommendation  52 y.o. female with known essential hypertension mixed hyperlipidemia with acute ST elevation myocardial infarction now improved after  PCI and stent placement 1.  Continue dual antiplatelet therapy 2.  High intensity cholesterol therapy 3.  Beta-blocker ACE inhibitor for hypertension control and myocardial infarction 4.  Begin transfer to telemetry and rehabilitation 5.  If ambulating well on Monday okay for discharge home with follow-up and further adjustments of medication management  Signed, Arnoldo Hooker M.D. FACC

## 2017-11-18 NOTE — Consult Note (Signed)
Name: Nicole Ayala MRN: [id number] DOB: 08-Oct-1965    LOS: 1  Referring Provider:  Dr. Luberta Mutter Reason for Referral:  STEMI  PULMONARY / CRITICAL CARE MEDICINE  HPI:  52 y/o female admitted with chest pain, ruled in for an STEMI and was taken to the cath lab for PCI with stent x2 placement to the LAD. Doing well post procedure.   Past Medical History:  Diagnosis Date  . Abnormal EKG   . GERD (gastroesophageal reflux disease)   . Hot flashes   . Hypercholesteremia   . Hypertension   . Kidney stones   . Low back pain   . Rheumatoid arthritis (HCC)   . Vitamin D deficiency    Past Surgical History:  Procedure Laterality Date  . CORONARY/GRAFT ACUTE MI REVASCULARIZATION N/A 11/17/2017   Procedure: Coronary/Graft Acute MI Revascularization;  Surgeon: Yvonne Kendall, MD;  Location: [facility] INVASIVE CV LAB;  Service: Cardiovascular;  Laterality: N/A;  . LEFT HEART CATH AND CORONARY ANGIOGRAPHY N/A 11/17/2017   Procedure: LEFT HEART CATH AND CORONARY ANGIOGRAPHY;  Surgeon: Yvonne Kendall, MD;  Location: [facility] INVASIVE CV LAB;  Service: Cardiovascular;  Laterality: N/A;  . TONSILLECTOMY    . TUBAL LIGATION     Prior to Admission medications   Medication Sig Start Date End Date Taking? Authorizing Provider  AMLODIPINE-ATORVASTATIN PO Take by mouth.    [provider]  carvedilol (COREG) 25 MG tablet Take by mouth.    [provider]  clonazePAM (KLONOPIN) 0.5 MG tablet Take 0.5 mg by mouth. 12/14/06   [provider]  colchicine 0.6 MG tablet Take 1 tablet (0.6 mg total) by mouth daily. 05/14/16   Hassan Rowan, MD  indomethacin (INDOCIN) 25 MG capsule Take 1 capsule (25 mg total) by mouth 2 (two) times daily with a meal. 05/14/16   Hassan Rowan, MD  lisinopril-hydrochlorothiazide [facility]) 20-12.5 MG tablet Take by mouth. 10/16/08   [provider]  omeprazole (PRILOSEC) 40 MG capsule Take 40 mg by mouth daily. Reported on 05/25/2015     [provider]  rosuvastatin (CRESTOR) 20 MG tablet Take 20 mg by mouth daily.    [provider]   Allergies No Known Allergies  Family History Family History  Problem Relation Age of Onset  . Heart failure Mother   . Cancer Father   . Hematuria Neg Hx   . Kidney disease Neg Hx   . Bladder Cancer Neg Hx    Social History  reports that she has been smoking cigarettes. She has been smoking about 1.00 pack per day. She has never used smokeless tobacco. She reports that she does not drink alcohol or use drugs.  Review Of Systems: All systems reviewed. Pertinent positives include chest pain that has resolved  Vital Signs: Temp:  [96.5 F (35.8 C)-98.2 F (36.8 C)] 97.4 F (36.3 C) (09/29 0800) Pulse Rate:  [56-73] 66 (09/29 0700) Resp:  [10-24] 17 (09/29 0700) BP: (85-193)/(47-116) 130/82 (09/29 0700) SpO2:  [96 %-100 %] 98 % (09/29 0700) Weight:  [56.7 kg-61.3 kg] 61.3 kg (09/28 1527)  Physical Examination: General:  NAD Neuro:  AAOX3, no deficits HEENT: PERRLA Cardiovascular:  RRR, S1/S1 Lungs: CTAB Abdomen:  NT/ND, NBS X4 Musculoskeletal:  +ROM Skin:  Warm and dry  ASSESSMENT  Active Problems:   STEMI (ST elevation myocardial infarction) (HCC)   STEMI involving left anterior descending coronary artery (HCC)  PLAN Per cardiology Resume home medications  Best Practice: Code Status:  Full. Diet: Heart Healthy /  Carb Mod. GI prophylaxis:  PPI. VTE prophylaxis:  SCD's / heparin.  Magdalene S. [facility] ANP-BC Pulmonary and Critical Care Medicine [facility] Pager [phone] or [phone]  NB: This document was prepared using Dragon voice recognition software and may include unintentional dictation errors.

## 2017-11-18 NOTE — Progress Notes (Addendum)
[facility] [place] at [facility]   PATIENT NAME: Nicole Ayala    MR#:  [id number]  DATE OF BIRTH:  Apr 01, 1965  SUBJECTIVE:   She denies chest pain.  REVIEW OF SYSTEMS:    Review of Systems  Constitutional: Negative for fever, chills weight loss HENT: Negative for ear pain, nosebleeds, congestion, facial swelling, rhinorrhea, neck pain, neck stiffness and ear discharge.   Respiratory: Negative for cough, shortness of breath, wheezing  Cardiovascular: Negative for chest pain, palpitations and leg swelling.  Gastrointestinal: Negative for heartburn, abdominal pain, vomiting, diarrhea or consitpation Genitourinary: Negative for dysuria, urgency, frequency, hematuria Musculoskeletal: Negative for back pain or joint pain Neurological: Negative for dizziness, seizures, syncope, focal weakness,  numbness and headaches.  Hematological: Does not bruise/bleed easily.  Psychiatric/Behavioral: Negative for hallucinations, confusion, dysphoric mood    Tolerating Diet: yes      DRUG ALLERGIES:  No Known Allergies  VITALS:  Blood pressure 117/84, pulse 67, temperature (!) 97.4 F (36.3 C), temperature source Axillary, resp. rate 13, height 4\' 11"  (1.499 m), weight 61.3 kg, SpO2 99 %.  PHYSICAL EXAMINATION:  Constitutional: Appears well-developed and well-nourished. No distress. HENT: Normocephalic. Oropharynx is clear and moist.  Eyes: Conjunctivae and EOM are normal. PERRLA, no scleral icterus.  Neck: Normal ROM. Neck supple. No JVD. No tracheal deviation. CVS: RRR, S1/S2 +, no murmurs, no gallops, no carotid bruit.  Pulmonary: Effort and breath sounds normal, no stridor, rhonchi, wheezes, rales.  Abdominal: Soft. BS +,  no distension, tenderness, rebound or guarding.  Musculoskeletal: Normal range of motion. No edema and no tenderness.  Neuro: Alert. CN 2-12 grossly intact. No focal deficits. Skin: Skin is warm and dry. No rash noted. Psychiatric: Normal mood  and affect.      LABORATORY PANEL:   CBC Recent Labs  Lab 11/18/17 0000  WBC 9.1  HGB 14.0  HCT 39.4  PLT 214   ------------------------------------------------------------------------------------------------------------------  Chemistries  Recent Labs  Lab 11/17/17 1621  11/18/17 0000  NA 138   < > 139  K 3.6   < > 3.5  CL 103   < > 104  CO2 29   < > 29  GLUCOSE 86   < > 110*  BUN 21*   < > 19  CREATININE 0.59  0.55   < > 0.56  CALCIUM 8.9   < > 9.2  AST 36  --   --   ALT 26  --   --   ALKPHOS 76  --   --   BILITOT 0.4  --   --    < > = values in this interval not displayed.   ------------------------------------------------------------------------------------------------------------------  Cardiac Enzymes Recent Labs  Lab 11/17/17 1800 11/18/17 0000 11/18/17 0516  TROPONINI 2.17* 6.03* 6.20*   ------------------------------------------------------------------------------------------------------------------  RADIOLOGY:  No results found.   ASSESSMENT AND PLAN:   52 year old female with a history of tobacco dependence and rheumatoid arthritis who presented with chest pain and found to have STEMI.  1.  STEMI: Patient was taken emergently to cardiac catheterization for PCI x2 to LAD. Continue dual antiplatelet therapy with aspirin and Brilinta for at least 12 months. Continue statin and Coreg He will need cardiac rehab at discharge 2.  Ischemic cardiomyopathy: Continue Coreg and lisinopril Titrate up as tolerated  3.  Essential hypertension: Continue Coreg and lisinopril  4.  Hyperlipidemia: Check lipid panel and continue statin for now  5.Tobacco dependence: Patient is encouraged to quit smoking. Counseling was provided  for 4 minutes.   Okay to transfer to telemetry with possible discharge tomorrow if patient continues to be chest pain-free.     Management plans discussed with the patient and she is in agreement.  CODE STATUS:  Full  TOTAL TIME TAKING CARE OF THIS PATIENT: 28 minutes.     POSSIBLE D/C tomorrow, DEPENDING ON CLINICAL CONDITION.   Calleigh Lafontant M.D on 11/18/2017 at 10:56 AM  Between 7am to 6pm - Pager - [phone] After 6pm go to [email or web address] - password [email or web address]  [facility]  Office  [phone]  CC: Primary care physician; Leanna Sato, MD  Note: This dictation was prepared with Dragon dictation along with smaller phrase technology. Any transcriptional errors that result from this process are unintentional.

## 2017-11-18 NOTE — Progress Notes (Signed)
TR band removed. Gauze and tegaderm applied.

## 2017-11-19 LAB — MAGNESIUM: Magnesium: 1.9 mg/dL (ref 1.7–2.4)

## 2017-11-19 LAB — GLUCOSE, CAPILLARY: Glucose-Capillary: 83 mg/dL (ref 70–99)

## 2017-11-19 LAB — BASIC METABOLIC PANEL
ANION GAP: 7 (ref 5–15)
BUN: 14 mg/dL (ref 6–20)
CALCIUM: 9.2 mg/dL (ref 8.9–10.3)
CO2: 26 mmol/L (ref 22–32)
Chloride: 107 mmol/L (ref 98–111)
Creatinine, Ser: 0.46 mg/dL (ref 0.44–1.00)
Glucose, Bld: 104 mg/dL — ABNORMAL HIGH (ref 70–99)
POTASSIUM: 3.9 mmol/L (ref 3.5–5.1)
Sodium: 140 mmol/L (ref 135–145)

## 2017-11-19 LAB — HIV ANTIBODY (ROUTINE TESTING W REFLEX): HIV SCREEN 4TH GENERATION: NONREACTIVE

## 2017-11-19 LAB — PHOSPHORUS: PHOSPHORUS: 3.3 mg/dL (ref 2.5–4.6)

## 2017-11-19 MED ORDER — ASPIRIN 81 MG PO CHEW: 81.0000 mg | CHEWABLE_TABLET | Freq: Every day | ORAL | 2 refills | Status: DC

## 2017-11-19 MED ORDER — LISINOPRIL-HYDROCHLOROTHIAZIDE 20-12.5 MG PO TABS: 1.0000 | ORAL_TABLET | Freq: Every day | ORAL | 2 refills | Status: DC

## 2017-11-19 MED ORDER — CARVEDILOL 6.25 MG PO TABS: 6.2500 mg | ORAL_TABLET | Freq: Two times a day (BID) | ORAL | 2 refills | Status: DC

## 2017-11-19 MED ORDER — TICAGRELOR 90 MG PO TABS: 90.0000 mg | ORAL_TABLET | Freq: Two times a day (BID) | ORAL | 2 refills | Status: DC

## 2017-11-19 MED ORDER — ROSUVASTATIN CALCIUM 20 MG PO TABS: 20.0000 mg | ORAL_TABLET | Freq: Every day | ORAL | 2 refills | Status: DC

## 2017-11-19 MED ORDER — ENOXAPARIN SODIUM 40 MG/0.4ML ~~LOC~~ SOLN: 40.0000 mg | SUBCUTANEOUS | Status: DC

## 2017-11-19 NOTE — Progress Notes (Signed)
[facility] Encounter Note  Patient: KALAYSIA DEMONBREUN / Admit Date: 11/17/2017 / Date of Encounter: 11/19/2017, 8:57 AM   Subjective: Patient is significantly improved at this time with no further episodes of chest discomfort after PCI and stent placement of culprit artery from ST elevation myocardial infarction.  Hemodynamically stable.  No evidence of significant rhythm disturbances.  Patient ambulating without evidence of significant symptoms  Review of Systems: Positive for: None Negative for: Vision change, hearing change, syncope, dizziness, nausea, vomiting,diarrhea, bloody stool, stomach pain, cough, congestion, diaphoresis, urinary frequency, urinary pain,skin lesions, skin rashes Others previously listed  Objective: Telemetry: Normal sinus rhythm Physical Exam: Blood pressure (!) 135/94, pulse 71, temperature 98.3 F (36.8 C), temperature source Oral, resp. rate (!) 21, height 4\' 11"  (1.499 m), weight 61.3 kg, SpO2 100 %. Body mass index is 27.3 kg/m. General: Well developed, well nourished, in no acute distress. Head: Normocephalic, atraumatic, sclera non-icteric, no xanthomas, nares are without discharge. Neck: No apparent masses Lungs: Normal respirations with no wheezes, no rhonchi, no rales , no crackles   Heart: Regular rate and rhythm, normal S1 S2, no murmur, no rub, no gallop, PMI is normal size and placement, carotid upstroke normal without bruit, jugular venous pressure normal Abdomen: Soft, non-tender, non-distended with normoactive bowel sounds. No hepatosplenomegaly. Abdominal aorta is normal size without bruit Extremities: No edema, no clubbing, no cyanosis, no ulcers,  Peripheral: 2+ radial, 2+ femoral, 2+ dorsal pedal pulses Neuro: Alert and oriented. Moves all extremities spontaneously. Psych:  Responds to questions appropriately with a normal affect.   Intake/Output Summary (Last 24 hours) at 11/19/2017 0857 Last data filed at 11/18/2017  1901 Gross per 24 hour  Intake 1432.88 ml  Output -  Net 1432.88 ml    Inpatient Medications:  . aspirin  81 mg Oral Daily  . carvedilol  3.125 mg Oral BID WC  . clonazePAM  0.5 mg Oral Daily  . docusate sodium  100 mg Oral BID  . enoxaparin (LOVENOX) injection  40 mg Subcutaneous Q24H  . Influenza vac split quadrivalent PF  0.5 mL Intramuscular Tomorrow-1000  . lisinopril  10 mg Oral Daily  . pneumococcal 23 valent vaccine  0.5 mL Intramuscular Tomorrow-1000  . rosuvastatin  20 mg Oral Daily  . sodium chloride flush  3 mL Intravenous Q12H  . ticagrelor  90 mg Oral BID   Infusions:  . sodium chloride      Labs: Recent Labs    11/18/17 0000 11/19/17 0448  NA 139 140  K 3.5 3.9  CL 104 107  CO2 29 26  GLUCOSE 110* 104*  BUN 19 14  CREATININE 0.56 0.46  CALCIUM 9.2 9.2  MG  --  1.9  PHOS  --  3.3   Recent Labs    11/17/17 1621  AST 36  ALT 26  ALKPHOS 76  BILITOT 0.4  PROT 6.1*  ALBUMIN 3.4*   Recent Labs    11/17/17 1318 11/18/17 0000  WBC 6.5 9.1  HGB 15.1 14.0  HCT 43.5 39.4  MCV 93.4 93.1  PLT 239 214   Recent Labs    11/17/17 1800 11/18/17 0000 11/18/17 0516  TROPONINI 2.17* 6.03* 6.20*   Invalid input(s): POCBNP No results for input(s): HGBA1C in the last 72 hours.   Weights: Filed Weights   11/17/17 1318 11/17/17 1527  Weight: 56.7 kg 61.3 kg     Radiology/Studies:  No results found.   Assessment and Recommendation  52 y.o. female with  known essential hypertension mixed hyperlipidemia with acute ST elevation myocardial infarction now improved after PCI and stent placement 1.  Continue dual antiplatelet therapy 2.  High intensity cholesterol therapy 3.  Beta-blocker ACE inhibitor for hypertension control and myocardial infarction 4.  Begin transfer to telemetry and rehabilitation 5.  If ambulating well on today okay for discharge home with follow-up next week and further adjustments of medication management  Signed, Arnoldo Hooker M.D. FACC

## 2017-11-19 NOTE — Discharge Summary (Signed)
[facility] - [place] at [facility]   PATIENT NAME: Nicole Ayala    MR#:  [id number]  DATE OF BIRTH:  12/02/1965  DATE OF ADMISSION:  11/17/2017   ADMITTING PHYSICIAN: No admitting provider for patient encounter.  DATE OF DISCHARGE: 11/19/2017 11:48 AM  PRIMARY CARE PHYSICIAN: Leanna Sato, MD   ADMISSION DIAGNOSIS:   ST elevation myocardial infarction (STEMI), unspecified artery (HCC) [I21.3] STEMI involving left anterior descending coronary artery (HCC) [I21.02]  DISCHARGE DIAGNOSIS:   Active Problems:   STEMI (ST elevation myocardial infarction) (HCC)   STEMI involving left anterior descending coronary artery (HCC)   SECONDARY DIAGNOSIS:   Past Medical History:  Diagnosis Date  . Abnormal EKG   . GERD (gastroesophageal reflux disease)   . Hot flashes   . Hypercholesteremia   . Hypertension   . Kidney stones   . Low back pain   . Rheumatoid arthritis (HCC)   . Vitamin D deficiency     HOSPITAL COURSE:   52 year old female with past medical history significant for hypertension, hyperlipidemia, ongoing smoking presents to hospital secondary to chest pain  1.  STEMI-presented with chest pain-appreciate cardiology consult -Cardiac cath showed 99% LAD high degree blockage.  Status post successful PCI to the LAD with 2 overlapping stents -Started on aspirin and continue Coreg, lisinopril and statin -Cardiac rehab recommended  2.  Hypertension-on Coreg, lisinopril and hydrochlorothiazide  3.  Hyperlipidemia-statin  4.  GERD-Prilosec  5.  Gout-on colchicine  Patient has been ambulatory.  Will be discharged home today  DISCHARGE CONDITIONS:   Guarded  CONSULTS OBTAINED:   Treatment Team:  Lamar Blinks, MD  DRUG ALLERGIES:   No Known Allergies DISCHARGE MEDICATIONS:   Allergies as of 11/19/2017   No Known Allergies     Medication List    STOP taking these medications   AMLODIPINE-ATORVASTATIN PO   indomethacin 25 MG  capsule Commonly known as:  INDOCIN     TAKE these medications   aspirin 81 MG chewable tablet Chew 1 tablet (81 mg total) by mouth daily.   carvedilol 6.25 MG tablet Commonly known as:  COREG Take 1 tablet (6.25 mg total) by mouth 2 (two) times daily with a meal. What changed:    medication strength  how much to take  when to take this   clonazePAM 0.5 MG tablet Commonly known as:  KLONOPIN Take 0.5 mg by mouth.   colchicine 0.6 MG tablet Take 1 tablet (0.6 mg total) by mouth daily.   lisinopril-hydrochlorothiazide 20-12.5 MG tablet Commonly known as:  PRINZIDE,ZESTORETIC Take 1 tablet by mouth daily. What changed:    how much to take  when to take this   omeprazole 40 MG capsule Commonly known as:  PRILOSEC Take 40 mg by mouth daily. Reported on 05/25/2015   rosuvastatin 20 MG tablet Commonly known as:  CRESTOR Take 1 tablet (20 mg total) by mouth daily.   ticagrelor 90 MG Tabs tablet Commonly known as:  BRILINTA Take 1 tablet (90 mg total) by mouth 2 (two) times daily.        DISCHARGE INSTRUCTIONS:   1.  PCP follow-up in 1 to 2 weeks 2.  Cardiology follow-up in 2 weeks 3.  Cardiac rehab  DIET:   Cardiac diet  ACTIVITY:   Activity as tolerated  OXYGEN:   Home Oxygen: No.  Oxygen Delivery: room air  DISCHARGE LOCATION:   home   If you experience worsening of your admission symptoms, develop shortness  of breath, life threatening emergency, suicidal or homicidal thoughts you must seek medical attention immediately by calling 911 or calling your MD immediately  if symptoms less severe.  You Must read complete instructions/literature along with all the possible adverse reactions/side effects for all the Medicines you take and that have been prescribed to you. Take any new Medicines after you have completely understood and accpet all the possible adverse reactions/side effects.   Please note  You were cared for by a hospitalist during your  hospital stay. If you have any questions about your discharge medications or the care you received while you were in the hospital after you are discharged, you can call the unit and asked to speak with the hospitalist on call if the hospitalist that took care of you is not available. Once you are discharged, your primary care physician will handle any further medical issues. Please note that NO REFILLS for any discharge medications will be authorized once you are discharged, as it is imperative that you return to your primary care physician (or establish a relationship with a primary care physician if you do not have one) for your aftercare needs so that they can reassess your need for medications and monitor your lab values.    On the day of Discharge:  VITAL SIGNS:   Blood pressure (!) 152/86, pulse 63, temperature 98.3 F (36.8 C), temperature source Oral, resp. rate 20, height 4\' 11"  (1.499 m), weight 61.3 kg, SpO2 99 %.  PHYSICAL EXAMINATION:    GENERAL:  52 y.o.-year-old patient lying in the bed with no acute distress.  EYES: Pupils equal, round, reactive to light and accommodation. No scleral icterus. Extraocular muscles intact.  HEENT: Head atraumatic, normocephalic. Oropharynx and nasopharynx clear.  NECK:  Supple, no jugular venous distention. No thyroid enlargement, no tenderness.  LUNGS: Normal breath sounds bilaterally, no wheezing, rales,rhonchi or crepitation. No use of accessory muscles of respiration.  CARDIOVASCULAR: S1, S2 normal. No murmurs, rubs, or gallops.  ABDOMEN: Soft, non-tender, non-distended. Bowel sounds present. No organomegaly or mass.  EXTREMITIES: No pedal edema, cyanosis, or clubbing.  NEUROLOGIC: Cranial nerves II through XII are intact. Muscle strength 5/5 in all extremities. Sensation intact. Gait not checked.  PSYCHIATRIC: The patient is alert and oriented x 3.  SKIN: No obvious rash, lesion, or ulcer.   DATA REVIEW:   CBC Recent Labs  Lab  11/18/17 0000  WBC 9.1  HGB 14.0  HCT 39.4  PLT 214    Chemistries  Recent Labs  Lab 11/17/17 1621  11/19/17 0448  NA 138   < > 140  K 3.6   < > 3.9  CL 103   < > 107  CO2 29   < > 26  GLUCOSE 86   < > 104*  BUN 21*   < > 14  CREATININE 0.59  0.55   < > 0.46  CALCIUM 8.9   < > 9.2  MG  --   --  1.9  AST 36  --   --   ALT 26  --   --   ALKPHOS 76  --   --   BILITOT 0.4  --   --    < > = values in this interval not displayed.     Microbiology Results  Results for orders placed or performed during the hospital encounter of 11/17/17  MRSA PCR Screening     Status: None   Collection Time: 11/17/17  3:29 PM  Result Value Ref  Range Status   MRSA by PCR NEGATIVE NEGATIVE Final    Comment:        The GeneXpert MRSA Assay (FDA approved for NASAL specimens only), is one component of a comprehensive MRSA colonization surveillance program. It is not intended to diagnose MRSA infection nor to guide or monitor treatment for MRSA infections. Performed at [facility], [address], [place], [place] [address]     RADIOLOGY:  No results found.   Management plans discussed with the patient, family and they are in agreement.  CODE STATUS:  Code Status History    Date Active Date Inactive Code Status Order ID Comments User Context   11/17/2017 1533 11/19/2017 1448 Full Code [id number]  Katha Hamming, MD Inpatient   11/17/2017 1527 11/17/2017 1533 Full Code [id number]  End, Cristal Deer, MD Inpatient      TOTAL TIME TAKING CARE OF THIS PATIENT: 38 minutes.    Staley Budzinski M.D on 11/19/2017 at 3:35 PM  Between 7am to 6pm - Pager - [phone]  After 6pm go to [email or web address] - Social research officer, government  [facility] [place] [facility]  Office  [phone]  CC: Primary care physician; Leanna Sato, MD   Note: This dictation was prepared with Dragon dictation along with smaller phrase technology. Any transcriptional errors that result from this  process are unintentional.

## 2017-11-19 NOTE — Care Management (Signed)
RNCM consult for Brilinta. Patient has Medicaid in place for medication coverage. Advised to contact DSS to apply for transportation assistance- she agreed.  She states her son Bethann Berkshire will provide transportation to home but works during the day and her other son does not have a Gaffer.  She is not requiring supplemental O2. She ambulates without assistance. PCP is with [facility]. She denies problems obtaining medications.

## 2018-08-23 ENCOUNTER — Encounter: Payer: Self-pay | Admitting: Emergency Medicine

## 2018-08-23 ENCOUNTER — Other Ambulatory Visit: Payer: Self-pay

## 2018-08-23 ENCOUNTER — Ambulatory Visit
Admission: EM | Admit: 2018-08-23 | Discharge: 2018-08-23 | Disposition: A | Discharge status: Home / Self Care | Payer: Medicaid Other | Attending: Family Medicine | Admitting: Family Medicine

## 2018-08-23 DIAGNOSIS — L237 Allergic contact dermatitis due to plants, except food: Secondary | ICD-10-CM

## 2018-08-23 DIAGNOSIS — R21 Rash and other nonspecific skin eruption: Secondary | ICD-10-CM

## 2018-08-23 DIAGNOSIS — L03113 Cellulitis of right upper limb: Secondary | ICD-10-CM

## 2018-08-23 MED ORDER — DOXYCYCLINE HYCLATE 100 MG PO CAPS: 100.0000 mg | ORAL_CAPSULE | Freq: Two times a day (BID) | ORAL | 0 refills | Status: DC

## 2018-08-23 MED ORDER — MUPIROCIN 2 % EX OINT: TOPICAL_OINTMENT | CUTANEOUS | 0 refills | Status: DC

## 2018-08-23 MED ORDER — PREDNISONE 10 MG PO TABS: ORAL_TABLET | ORAL | 0 refills | Status: DC

## 2018-08-23 MED ORDER — HYDROXYZINE HCL 25 MG PO TABS: 25.0000 mg | ORAL_TABLET | Freq: Three times a day (TID) | ORAL | 0 refills | Status: DC | PRN

## 2018-08-23 NOTE — Discharge Instructions (Addendum)
Take medication as prescribed. Rest. Drink plenty of fluids. Avoid scratching. Cool compresses. Monitor closely.   Follow up with your primary care physician this week .Return to Urgent care for new or worsening concerns.

## 2018-08-23 NOTE — ED Provider Notes (Addendum)
[facility] ____________________________________________  Time seen: Approximately 1:04 PM  I have reviewed the triage vital signs and the nursing notes.   HISTORY  Chief Complaint Rash   HPI Nicole Ayala is a 53 y.o. female presenting for evaluation of itchy rash.  Patient reports that she has been working outside this past week weeding and pulling weeds.  Patient states that she believes she may come in contact with poison oak or ivy.  States Tuesday she started to have a very itchy rash to right forearm that has since spread quickly.  States also became really red and some warmness to touch to right arm as well.  Denies drainage.  Denies fevers.  States rash is itchy.  Occasional some pain.  Unresolved with over-the-counter anti-itch medications.  Also states she clean the area with Clorox.  Denies insect bite.  Denies chest pain, shortness of breath, difficulty breathing or swallowing, sore throat or recent sickness.  No recent antibiotic use.  Denies other aggravating alleviating factors.  Denies any recent medication or medical changes.  Leanna Sato, MD: PCP   Past Medical History:  Diagnosis Date   Abnormal EKG    GERD (gastroesophageal reflux disease)    Hot flashes    Hypercholesteremia    Hypertension    Kidney stones    Low back pain    Rheumatoid arthritis (HCC)    Vitamin D deficiency     Patient Active Problem List   Diagnosis Date Noted   STEMI (ST elevation myocardial infarction) (HCC) 11/17/2017   STEMI involving left anterior descending coronary artery (HCC) 11/17/2017   Nephrolithiasis 05/26/2015   Incontinence 05/26/2015   Gonalgia 05/13/2014   Encounter for screening for malignant neoplasm of colon 05/13/2014   Soft tissue lesion of shoulder region 12/05/2011    Past Surgical History:  Procedure Laterality Date   CORONARY/GRAFT ACUTE MI REVASCULARIZATION N/A 11/17/2017   Procedure: Coronary/Graft Acute MI  Revascularization;  Surgeon: Yvonne Kendall, MD;  Location: [facility] IN[facility]IVE [facility];  Service: Cardiovascular;  Laterality: N/A;   LEFT HEART CATH AND CORONARY ANGIOGRAPHY N/A 11/17/2017   Procedure: LEFT HEART CATH AND CORONARY ANGIOGRAPHY;  Surgeon: Yvonne Kendall, MD;  Location: [facility] INV[facility] CV [facility];  Service: Cardiovascular;  Laterality: N/A;   TONSILLECTOMY     TUBAL LIGATION       No current facility-administered medications for this encounter.   Current Outpatient Medications:    carvedilol (COREG) 6.25 MG tablet, Take 1 tablet (6.25 mg total) by mouth 2 (two) times daily with a meal., Disp: 60 tablet, Rfl: 2   clonazePAM (KLONOPIN) 0.5 MG tablet, Take 0.5 mg by mouth., Disp: , Rfl:    lisinopril-hydrochlorothiazide (PRINZIDE,ZESTORETIC) 20-12.5 MG tablet, Take 1 tablet by mouth daily., Disp: 30 tablet, Rfl: 2   ticagrelor (BRILINTA) 90 MG TABS tablet, Take 1 tablet (90 mg total) by mouth 2 (two) times daily., Disp: 60 tablet, Rfl: 2   doxycycline (VIBRAMYCIN) 100 MG capsule, Take 1 capsule (100 mg total) by mouth 2 (two) times daily., Disp: 20 capsule, Rfl: 0   hydrOXYzine (ATARAX/VISTARIL) 25 MG tablet, Take 1 tablet (25 mg total) by mouth 3 (three) times daily as needed for itching., Disp: 15 tablet, Rfl: 0   mupirocin ointment (BACTROBAN) 2 %, Apply two times a day for 7 days., Disp: 22 g, Rfl: 0   predniSONE (DELTASONE) 10 MG tablet, Take 60mg  orally day one, then 55 mg orally day two, then 50 mg orally day three, then taper by  5 mg daily over 12 days until complete., Disp: 35 tablet, Rfl: 0  Allergies Patient has no known allergies.  Family History  Problem Relation Age of Onset   Heart failure Mother    Cancer Father    Hematuria Neg Hx    Kidney disease Neg Hx    Bladder Cancer Neg Hx     Social History Social History   Tobacco Use   Smoking status: Current Every Day Smoker    Packs/day: 1.00    Types: Cigarettes   Smokeless tobacco: Never Used    Substance Use Topics   Alcohol use: No   Drug use: No    Review of Systems Constitutional: No fever ENT: No sore throat. Cardiovascular: Denies chest pain. Respiratory: Denies shortness of breath. Gastrointestinal: No abdominal pain.   Musculoskeletal: Negative for back pain. Skin: Positive rash  ____________________________________________   PHYSICAL EXAM:  VITAL SIGNS: ED Triage Vitals  Enc Vitals Group     BP 08/23/18 1231 121/75     Pulse Rate 08/23/18 1231 65     Resp 08/23/18 1231 16     Temp 08/23/18 1231 97.8 F (36.6 C)     Temp Source 08/23/18 1231 Oral     SpO2 08/23/18 1231 100 %     Weight 08/23/18 1227 135 lb 2.3 oz (61.3 kg)     Height 08/23/18 1227 4\' 11"  (1.499 m)     Head Circumference --      Peak Flow --      Pain Score 08/23/18 1227 8     Pain Loc --      Pain Edu? --      Excl. in GC? --     Constitutional: Alert and oriented. Well appearing and in no acute distress. ENT      Head: Normocephalic and atraumatic. Cardiovascular: Normal rate, regular rhythm. Grossly normal heart sounds.  Good peripheral circulation. Respiratory: Normal respiratory effort without tachypnea nor retractions. Breath sounds are clear and equal bilaterally. No wheezes, rales, rhonchi. Musculoskeletal:  Steady gait.  Bilateral distal radial pulses equal and easily palpated. Neurologic:  Normal speech and language. Speech is normal. No gait instability.  Skin:  Skin is warm, dry.  Except: Scattered erythematous pruritic maculopapular rash present to bilateral upper extremities and bilateral lower extremities, nontender, no drainage.  Right mid upper arm to beneath elbow area of similar rash with surrounding erythema, and minimal induration, no drainage, full range of motion present, no bony tenderness, minimal tenderness, no drainage. Psychiatric: Mood and affect are normal. Speech and behavior are normal. Patient exhibits appropriate insight and judgment.    ___________________________________________   LABS (all labs ordered are listed, but only abnormal results are displayed)  Labs Reviewed - No data to display ____________________________________________   PROCEDURES Procedures    INITIAL IMPRESSION / ASSESSMENT AND PLAN / ED COURSE  Pertinent labs & imaging results that were available during my care of the patient were reviewed by me and considered in my medical decision making (see chart for details).  Well-appearing patient.  No acute distress.  Suspect poison ivy or poison oak contact dermatitis with secondary cellulitis to right arm.  Will treat with prednisone, PRN hydroxyzine, doxycycline and Bactroban.  Encouraged rest, fluids, supportive care, cool compresses and avoidance of scratching.not to use Clorox.  Discussed indication, risks and benefits of medications with patient, including photosensitivity with antibiotic.  Discussed follow up with Primary care physician this week. Discussed follow up and return parameters including no resolution  or any worsening concerns. Patient verbalized understanding and agreed to plan.   ____________________________________________   FINAL CLINICAL IMPRESSION(S) / ED DIAGNOSES  Final diagnoses:  Allergic contact dermatitis due to plants, except food  Right arm cellulitis     ED Discharge Orders         Ordered    predniSONE (DELTASONE) 10 MG tablet     08/23/18 1303    hydrOXYzine (ATARAX/VISTARIL) 25 MG tablet  3 times daily PRN     08/23/18 1303    mupirocin ointment (BACTROBAN) 2 %     08/23/18 1303    doxycycline (VIBRAMYCIN) 100 MG capsule  2 times daily     08/23/18 1303           Note: This dictation was prepared with Dragon dictation along with smaller phrase technology. Any transcriptional errors that result from this process are unintentional.         Marylene Land, NP 08/23/18 1348    Marylene Land, NP 08/23/18 1348

## 2018-08-23 NOTE — ED Triage Notes (Signed)
Patient c/o blistery red rash on her right arm and legs that started on Monday.  Patient denies fevers.

## 2018-09-13 ENCOUNTER — Encounter: Payer: Self-pay | Admitting: *Deleted

## 2018-09-13 ENCOUNTER — Emergency Department
Admission: EM | Admit: 2018-09-13 | Discharge: 2018-09-13 | Disposition: A | Discharge status: Home / Self Care | Payer: Medicaid Other | Attending: Emergency Medicine | Admitting: Emergency Medicine

## 2018-09-13 ENCOUNTER — Other Ambulatory Visit: Payer: Self-pay

## 2018-09-13 DIAGNOSIS — Z79899 Other long term (current) drug therapy: Secondary | ICD-10-CM | POA: Diagnosis not present

## 2018-09-13 DIAGNOSIS — I10 Essential (primary) hypertension: Secondary | ICD-10-CM | POA: Insufficient documentation

## 2018-09-13 DIAGNOSIS — T50901A Poisoning by unspecified drugs, medicaments and biological substances, accidental (unintentional), initial encounter: Secondary | ICD-10-CM | POA: Diagnosis not present

## 2018-09-13 DIAGNOSIS — T50904A Poisoning by unspecified drugs, medicaments and biological substances, undetermined, initial encounter: Secondary | ICD-10-CM | POA: Diagnosis present

## 2018-09-13 DIAGNOSIS — F17210 Nicotine dependence, cigarettes, uncomplicated: Secondary | ICD-10-CM | POA: Insufficient documentation

## 2018-09-13 DIAGNOSIS — N390 Urinary tract infection, site not specified: Secondary | ICD-10-CM | POA: Insufficient documentation

## 2018-09-13 DIAGNOSIS — I252 Old myocardial infarction: Secondary | ICD-10-CM | POA: Diagnosis not present

## 2018-09-13 LAB — BASIC METABOLIC PANEL
Anion gap: 9 (ref 5–15)
BUN: 17 mg/dL (ref 6–20)
CO2: 23 mmol/L (ref 22–32)
Calcium: 9.4 mg/dL (ref 8.9–10.3)
Chloride: 109 mmol/L (ref 98–111)
Creatinine, Ser: 0.7 mg/dL (ref 0.44–1.00)
GFR calc Af Amer: >60 mL/min (ref >60)
GFR calc non Af Amer: >60 mL/min (ref >60)
Glucose, Bld: 110 mg/dL — ABNORMAL HIGH (ref 70–99)
Potassium: 3.7 mmol/L (ref 3.5–5.1)
Sodium: 141 mmol/L (ref 135–145)

## 2018-09-13 LAB — CBC WITH DIFFERENTIAL/PLATELET
Abs Immature Granulocytes: 0.02 10*3/uL (ref 0.00–0.07)
Basophils Absolute: 0 10*3/uL (ref 0.0–0.1)
Basophils Relative: 0 %
Eosinophils Absolute: 0.2 10*3/uL (ref 0.0–0.5)
Eosinophils Relative: 2 %
HCT: 42.4 % (ref 36.0–46.0)
Hemoglobin: 14.6 g/dL (ref 12.0–15.0)
Immature Granulocytes: 0 %
Lymphocytes Relative: 27 %
Lymphs Abs: 2.1 10*3/uL (ref 0.7–4.0)
MCH: 31.2 pg (ref 26.0–34.0)
MCHC: 34.4 g/dL (ref 30.0–36.0)
MCV: 90.6 fL (ref 80.0–100.0)
Monocytes Absolute: 0.5 10*3/uL (ref 0.1–1.0)
Monocytes Relative: 7 %
Neutro Abs: 5.2 10*3/uL (ref 1.7–7.7)
Neutrophils Relative %: 64 %
Platelets: 234 10*3/uL (ref 150–400)
RBC: 4.68 MIL/uL (ref 3.87–5.11)
RDW: 12.3 % (ref 11.5–15.5)
WBC: 8 10*3/uL (ref 4.0–10.5)
nRBC: 0 % (ref 0.0–0.2)

## 2018-09-13 LAB — URINALYSIS, COMPLETE (UACMP) WITH MICROSCOPIC
Bilirubin Urine: NEGATIVE
Glucose, UA: NEGATIVE mg/dL
Hgb urine dipstick: NEGATIVE
Ketones, ur: NEGATIVE mg/dL
Nitrite: POSITIVE — AB
Protein, ur: 30 mg/dL — AB
Specific Gravity, Urine: 1.02 (ref 1.005–1.030)
WBC, UA: >50 WBC/hpf — ABNORMAL HIGH (ref 0–5)
pH: 7 (ref 5.0–8.0)

## 2018-09-13 LAB — URINE DRUG SCREEN, QUALITATIVE (ARMC ONLY)
Amphetamines, Ur Screen: POSITIVE — AB
Barbiturates, Ur Screen: NOT DETECTED
Benzodiazepine, Ur Scrn: NOT DETECTED
Cannabinoid 50 Ng, Ur ~~LOC~~: NOT DETECTED
Cocaine Metabolite,Ur ~~LOC~~: NOT DETECTED
MDMA (Ecstasy)Ur Screen: NOT DETECTED
Methadone Scn, Ur: NOT DETECTED
Opiate, Ur Screen: NOT DETECTED
Phencyclidine (PCP) Ur S: NOT DETECTED
Tricyclic, Ur Screen: NOT DETECTED

## 2018-09-13 LAB — ETHANOL: Alcohol, Ethyl (B): <10 mg/dL (ref <10)

## 2018-09-13 LAB — TROPONIN I (HIGH SENSITIVITY)
Troponin I (High Sensitivity): 5 ng/L (ref <18)
Troponin I (High Sensitivity): 5 ng/L (ref <18)

## 2018-09-13 MED ORDER — CEPHALEXIN 500 MG PO CAPS
500.0000 mg | ORAL_CAPSULE | Freq: Once | ORAL | Status: AC
  Administered 2018-09-13: 500 mg via ORAL
  Filled 2018-09-13: qty 1

## 2018-09-13 MED ORDER — CEPHALEXIN 500 MG PO CAPS: 500.0000 mg | ORAL_CAPSULE | Freq: Two times a day (BID) | ORAL | 0 refills | Status: DC

## 2018-09-13 MED ORDER — LORAZEPAM 2 MG/ML IJ SOLN
1.0000 mg | Freq: Once | INTRAMUSCULAR | Status: AC
  Administered 2018-09-13: 1 mg via INTRAVENOUS
  Filled 2018-09-13: qty 1
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
## 2018-09-13 NOTE — Discharge Instructions (Addendum)
Return to the ER for any new or worsening symptoms including palpitations, difficulty breathing, weakness, severe anxiety, or any other new or worsening symptoms that concern you.

## 2018-09-16 ENCOUNTER — Other Ambulatory Visit: Payer: Self-pay

## 2018-09-16 ENCOUNTER — Emergency Department: Payer: Medicaid Other

## 2018-09-16 ENCOUNTER — Encounter: Payer: Self-pay | Admitting: Emergency Medicine

## 2018-09-16 ENCOUNTER — Emergency Department
Admission: EM | Admit: 2018-09-16 | Discharge: 2018-09-16 | Disposition: A | Discharge status: Home / Self Care | Payer: Medicaid Other | Attending: Emergency Medicine | Admitting: Emergency Medicine

## 2018-09-16 DIAGNOSIS — Y999 Unspecified external cause status: Secondary | ICD-10-CM | POA: Diagnosis not present

## 2018-09-16 DIAGNOSIS — F17210 Nicotine dependence, cigarettes, uncomplicated: Secondary | ICD-10-CM | POA: Insufficient documentation

## 2018-09-16 DIAGNOSIS — Z23 Encounter for immunization: Secondary | ICD-10-CM | POA: Insufficient documentation

## 2018-09-16 DIAGNOSIS — Y92411 Interstate highway as the place of occurrence of the external cause: Secondary | ICD-10-CM | POA: Insufficient documentation

## 2018-09-16 DIAGNOSIS — N390 Urinary tract infection, site not specified: Secondary | ICD-10-CM

## 2018-09-16 DIAGNOSIS — R52 Pain, unspecified: Secondary | ICD-10-CM

## 2018-09-16 DIAGNOSIS — M25551 Pain in right hip: Secondary | ICD-10-CM | POA: Insufficient documentation

## 2018-09-16 DIAGNOSIS — Y939 Activity, unspecified: Secondary | ICD-10-CM | POA: Insufficient documentation

## 2018-09-16 DIAGNOSIS — Z79899 Other long term (current) drug therapy: Secondary | ICD-10-CM | POA: Insufficient documentation

## 2018-09-16 DIAGNOSIS — R030 Elevated blood-pressure reading, without diagnosis of hypertension: Secondary | ICD-10-CM

## 2018-09-16 DIAGNOSIS — S70211A Abrasion, right hip, initial encounter: Secondary | ICD-10-CM | POA: Insufficient documentation

## 2018-09-16 DIAGNOSIS — M79602 Pain in left arm: Secondary | ICD-10-CM | POA: Diagnosis not present

## 2018-09-16 DIAGNOSIS — S161XXA Strain of muscle, fascia and tendon at neck level, initial encounter: Secondary | ICD-10-CM | POA: Diagnosis not present

## 2018-09-16 DIAGNOSIS — T07XXXA Unspecified multiple injuries, initial encounter: Secondary | ICD-10-CM

## 2018-09-16 DIAGNOSIS — R51 Headache: Secondary | ICD-10-CM | POA: Diagnosis present

## 2018-09-16 HISTORY — DX: Acute myocardial infarction, unspecified: I21.9

## 2018-09-16 LAB — URINE DRUG SCREEN, QUALITATIVE (ARMC ONLY)
Amphetamines, Ur Screen: POSITIVE — AB
Barbiturates, Ur Screen: NOT DETECTED
Benzodiazepine, Ur Scrn: NOT DETECTED
Cannabinoid 50 Ng, Ur ~~LOC~~: NOT DETECTED
Cocaine Metabolite,Ur ~~LOC~~: NOT DETECTED
MDMA (Ecstasy)Ur Screen: NOT DETECTED
Methadone Scn, Ur: NOT DETECTED
Opiate, Ur Screen: NOT DETECTED
Phencyclidine (PCP) Ur S: NOT DETECTED
Tricyclic, Ur Screen: NOT DETECTED

## 2018-09-16 LAB — URINALYSIS, COMPLETE (UACMP) WITH MICROSCOPIC
Bilirubin Urine: NEGATIVE
Glucose, UA: NEGATIVE mg/dL
Ketones, ur: NEGATIVE mg/dL
Nitrite: POSITIVE — AB
Protein, ur: NEGATIVE mg/dL
Specific Gravity, Urine: 1.015 (ref 1.005–1.030)
WBC, UA: >50 WBC/hpf — ABNORMAL HIGH (ref 0–5)
pH: 6 (ref 5.0–8.0)

## 2018-09-16 MED ORDER — TETANUS-DIPHTH-ACELL PERTUSSIS 5-2.5-18.5 LF-MCG/0.5 IM SUSP
0.5000 mL | Freq: Once | INTRAMUSCULAR | Status: AC
  Administered 2018-09-16: 0.5 mL via INTRAMUSCULAR
  Filled 2018-09-16: qty 0.5

## 2018-09-16 MED ORDER — IBUPROFEN 600 MG PO TABS: 600.0000 mg | ORAL_TABLET | Freq: Three times a day (TID) | ORAL | 0 refills | Status: DC | PRN

## 2018-09-16 MED ORDER — CEPHALEXIN 500 MG PO CAPS: 500.0000 mg | ORAL_CAPSULE | Freq: Three times a day (TID) | ORAL | 0 refills | Status: DC
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
## 2020-01-21 ENCOUNTER — Other Ambulatory Visit: Payer: Self-pay

## 2020-01-21 ENCOUNTER — Encounter: Payer: Self-pay | Admitting: Intensive Care

## 2020-01-21 ENCOUNTER — Emergency Department
Admission: EM | Admit: 2020-01-21 | Discharge: 2020-01-21 | Disposition: A | Discharge status: Home / Self Care | Payer: Medicaid Other | Attending: Emergency Medicine | Admitting: Emergency Medicine

## 2020-01-21 DIAGNOSIS — Z5321 Procedure and treatment not carried out due to patient leaving prior to being seen by health care provider: Secondary | ICD-10-CM | POA: Insufficient documentation

## 2020-01-21 DIAGNOSIS — M79602 Pain in left arm: Secondary | ICD-10-CM | POA: Diagnosis not present
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
## 2021-06-06 ENCOUNTER — Other Ambulatory Visit: Payer: Self-pay

## 2021-06-06 ENCOUNTER — Emergency Department: Payer: Medicaid Other

## 2021-06-06 ENCOUNTER — Inpatient Hospital Stay
Admission: EM | Admit: 2021-06-06 | Discharge: 2021-06-10 | DRG: 917 | Disposition: A | Discharge status: Home / Self Care | Payer: Medicaid Other | Attending: Internal Medicine | Admitting: Internal Medicine

## 2021-06-06 DIAGNOSIS — F32A Depression, unspecified: Secondary | ICD-10-CM | POA: Diagnosis present

## 2021-06-06 DIAGNOSIS — J9602 Acute respiratory failure with hypercapnia: Secondary | ICD-10-CM | POA: Diagnosis present

## 2021-06-06 DIAGNOSIS — R778 Other specified abnormalities of plasma proteins: Secondary | ICD-10-CM | POA: Diagnosis present

## 2021-06-06 DIAGNOSIS — I25119 Atherosclerotic heart disease of native coronary artery with unspecified angina pectoris: Secondary | ICD-10-CM | POA: Diagnosis present

## 2021-06-06 DIAGNOSIS — Z79899 Other long term (current) drug therapy: Secondary | ICD-10-CM

## 2021-06-06 DIAGNOSIS — A4159 Other Gram-negative sepsis: Secondary | ICD-10-CM | POA: Diagnosis not present

## 2021-06-06 DIAGNOSIS — Z635 Disruption of family by separation and divorce: Secondary | ICD-10-CM

## 2021-06-06 DIAGNOSIS — K219 Gastro-esophageal reflux disease without esophagitis: Secondary | ICD-10-CM | POA: Diagnosis present

## 2021-06-06 DIAGNOSIS — Z7902 Long term (current) use of antithrombotics/antiplatelets: Secondary | ICD-10-CM

## 2021-06-06 DIAGNOSIS — I2510 Atherosclerotic heart disease of native coronary artery without angina pectoris: Secondary | ICD-10-CM | POA: Diagnosis present

## 2021-06-06 DIAGNOSIS — N179 Acute kidney failure, unspecified: Secondary | ICD-10-CM | POA: Diagnosis not present

## 2021-06-06 DIAGNOSIS — R739 Hyperglycemia, unspecified: Secondary | ICD-10-CM | POA: Diagnosis present

## 2021-06-06 DIAGNOSIS — M069 Rheumatoid arthritis, unspecified: Secondary | ICD-10-CM | POA: Diagnosis present

## 2021-06-06 DIAGNOSIS — E559 Vitamin D deficiency, unspecified: Secondary | ICD-10-CM | POA: Diagnosis present

## 2021-06-06 DIAGNOSIS — I160 Hypertensive urgency: Secondary | ICD-10-CM | POA: Diagnosis not present

## 2021-06-06 DIAGNOSIS — G928 Other toxic encephalopathy: Secondary | ICD-10-CM | POA: Diagnosis present

## 2021-06-06 DIAGNOSIS — G4700 Insomnia, unspecified: Secondary | ICD-10-CM | POA: Diagnosis present

## 2021-06-06 DIAGNOSIS — Z9861 Coronary angioplasty status: Secondary | ICD-10-CM

## 2021-06-06 DIAGNOSIS — E860 Dehydration: Secondary | ICD-10-CM | POA: Diagnosis present

## 2021-06-06 DIAGNOSIS — I248 Other forms of acute ischemic heart disease: Secondary | ICD-10-CM | POA: Diagnosis present

## 2021-06-06 DIAGNOSIS — Z8249 Family history of ischemic heart disease and other diseases of the circulatory system: Secondary | ICD-10-CM

## 2021-06-06 DIAGNOSIS — F17210 Nicotine dependence, cigarettes, uncomplicated: Secondary | ICD-10-CM | POA: Diagnosis present

## 2021-06-06 DIAGNOSIS — A419 Sepsis, unspecified organism: Secondary | ICD-10-CM | POA: Diagnosis present

## 2021-06-06 DIAGNOSIS — Z809 Family history of malignant neoplasm, unspecified: Secondary | ICD-10-CM | POA: Diagnosis not present

## 2021-06-06 DIAGNOSIS — G9341 Metabolic encephalopathy: Secondary | ICD-10-CM | POA: Diagnosis present

## 2021-06-06 DIAGNOSIS — T50901A Poisoning by unspecified drugs, medicaments and biological substances, accidental (unintentional), initial encounter: Secondary | ICD-10-CM | POA: Diagnosis not present

## 2021-06-06 DIAGNOSIS — T50904A Poisoning by unspecified drugs, medicaments and biological substances, undetermined, initial encounter: Principal | ICD-10-CM

## 2021-06-06 DIAGNOSIS — I252 Old myocardial infarction: Secondary | ICD-10-CM | POA: Diagnosis not present

## 2021-06-06 DIAGNOSIS — E7800 Pure hypercholesterolemia, unspecified: Secondary | ICD-10-CM | POA: Diagnosis present

## 2021-06-06 DIAGNOSIS — T43621A Poisoning by amphetamines, accidental (unintentional), initial encounter: Secondary | ICD-10-CM | POA: Diagnosis present

## 2021-06-06 DIAGNOSIS — R404 Transient alteration of awareness: Secondary | ICD-10-CM | POA: Diagnosis present

## 2021-06-06 DIAGNOSIS — N390 Urinary tract infection, site not specified: Secondary | ICD-10-CM | POA: Diagnosis present

## 2021-06-06 DIAGNOSIS — I10 Essential (primary) hypertension: Secondary | ICD-10-CM | POA: Diagnosis present

## 2021-06-06 DIAGNOSIS — Z20822 Contact with and (suspected) exposure to covid-19: Secondary | ICD-10-CM | POA: Diagnosis present

## 2021-06-06 DIAGNOSIS — E8729 Other acidosis: Secondary | ICD-10-CM | POA: Diagnosis present

## 2021-06-06 DIAGNOSIS — F4325 Adjustment disorder with mixed disturbance of emotions and conduct: Secondary | ICD-10-CM | POA: Diagnosis not present

## 2021-06-06 LAB — HIV ANTIBODY (ROUTINE TESTING W REFLEX): HIV Screen 4th Generation wRfx: NONREACTIVE

## 2021-06-06 LAB — CBC WITH DIFFERENTIAL/PLATELET
Abs Immature Granulocytes: 0.06 10*3/uL (ref 0.00–0.07)
Basophils Absolute: 0 10*3/uL (ref 0.0–0.1)
Basophils Relative: 0 %
Eosinophils Absolute: 0.1 10*3/uL (ref 0.0–0.5)
Eosinophils Relative: 1 %
HCT: 45.3 % (ref 36.0–46.0)
Hemoglobin: 14.3 g/dL (ref 12.0–15.0)
Immature Granulocytes: 1 %
Lymphocytes Relative: 15 %
Lymphs Abs: 1.4 10*3/uL (ref 0.7–4.0)
MCH: 30 pg (ref 26.0–34.0)
MCHC: 31.6 g/dL (ref 30.0–36.0)
MCV: 95 fL (ref 80.0–100.0)
Monocytes Absolute: 0.2 10*3/uL (ref 0.1–1.0)
Monocytes Relative: 2 %
Neutro Abs: 8 10*3/uL — ABNORMAL HIGH (ref 1.7–7.7)
Neutrophils Relative %: 81 %
Platelets: 222 10*3/uL (ref 150–400)
RBC: 4.77 MIL/uL (ref 3.87–5.11)
RDW: 12.2 % (ref 11.5–15.5)
WBC: 9.8 10*3/uL (ref 4.0–10.5)
nRBC: 0 % (ref 0.0–0.2)

## 2021-06-06 LAB — URINALYSIS, COMPLETE (UACMP) WITH MICROSCOPIC
Bilirubin Urine: NEGATIVE
Glucose, UA: >=500 mg/dL — AB
Ketones, ur: NEGATIVE mg/dL
Nitrite: NEGATIVE
Protein, ur: 100 mg/dL — AB
Specific Gravity, Urine: 1.014 (ref 1.005–1.030)
WBC, UA: >50 WBC/hpf — ABNORMAL HIGH (ref 0–5)
pH: 5 (ref 5.0–8.0)

## 2021-06-06 LAB — BLOOD GAS, VENOUS
Acid-base deficit: 2.4 mmol/L — ABNORMAL HIGH (ref 0.0–2.0)
Bicarbonate: 27.5 mmol/L (ref 20.0–28.0)
O2 Saturation: 33.7 %
Patient temperature: 37
pCO2, Ven: 72 mmHg (ref 44–60)
pH, Ven: 7.19 — CL (ref 7.25–7.43)

## 2021-06-06 LAB — GLUCOSE, CAPILLARY
Glucose-Capillary: 47 mg/dL — ABNORMAL LOW (ref 70–99)
Glucose-Capillary: 166 mg/dL — ABNORMAL HIGH (ref 70–99)
Glucose-Capillary: 113 mg/dL — ABNORMAL HIGH (ref 70–99)
Glucose-Capillary: 64 mg/dL — ABNORMAL LOW (ref 70–99)
Glucose-Capillary: 71 mg/dL (ref 70–99)
Glucose-Capillary: 60 mg/dL — ABNORMAL LOW (ref 70–99)
Glucose-Capillary: 78 mg/dL (ref 70–99)
Glucose-Capillary: 98 mg/dL (ref 70–99)

## 2021-06-06 LAB — COMPREHENSIVE METABOLIC PANEL
ALT: 18 U/L (ref 0–44)
AST: 30 U/L (ref 15–41)
Albumin: 3.8 g/dL (ref 3.5–5.0)
Alkaline Phosphatase: 113 U/L (ref 38–126)
Anion gap: 11 (ref 5–15)
BUN: 19 mg/dL (ref 6–20)
CO2: 25 mmol/L (ref 22–32)
Calcium: 8.9 mg/dL (ref 8.9–10.3)
Chloride: 99 mmol/L (ref 98–111)
Creatinine, Ser: 1.05 mg/dL — ABNORMAL HIGH (ref 0.44–1.00)
GFR, Estimated: >60 mL/min (ref >60)
Glucose, Bld: 398 mg/dL — ABNORMAL HIGH (ref 70–99)
Potassium: 4.7 mmol/L (ref 3.5–5.1)
Sodium: 135 mmol/L (ref 135–145)
Total Bilirubin: 0.8 mg/dL (ref 0.3–1.2)
Total Protein: 7.5 g/dL (ref 6.5–8.1)

## 2021-06-06 LAB — APTT: aPTT: 27 seconds (ref 24–36)

## 2021-06-06 LAB — URINE DRUG SCREEN, QUALITATIVE (ARMC ONLY)
Amphetamines, Ur Screen: POSITIVE — AB
Barbiturates, Ur Screen: NOT DETECTED
Benzodiazepine, Ur Scrn: NOT DETECTED
Cannabinoid 50 Ng, Ur ~~LOC~~: NOT DETECTED
Cocaine Metabolite,Ur ~~LOC~~: NOT DETECTED
MDMA (Ecstasy)Ur Screen: NOT DETECTED
Methadone Scn, Ur: NOT DETECTED
Opiate, Ur Screen: NOT DETECTED
Phencyclidine (PCP) Ur S: NOT DETECTED
Tricyclic, Ur Screen: NOT DETECTED

## 2021-06-06 LAB — SALICYLATE LEVEL: Salicylate Lvl: <7 mg/dL — ABNORMAL LOW (ref 7.0–30.0)

## 2021-06-06 LAB — PROTIME-INR
INR: 1 (ref 0.8–1.2)
Prothrombin Time: 13.1 seconds (ref 11.4–15.2)

## 2021-06-06 LAB — ETHANOL: Alcohol, Ethyl (B): <10 mg/dL (ref <10)

## 2021-06-06 LAB — TROPONIN I (HIGH SENSITIVITY)
Troponin I (High Sensitivity): 466 ng/L (ref <18)
Troponin I (High Sensitivity): 638 ng/L (ref <18)
Troponin I (High Sensitivity): 773 ng/L (ref <18)
Troponin I (High Sensitivity): 934 ng/L (ref <18)

## 2021-06-06 LAB — CBG MONITORING, ED: Glucose-Capillary: 327 mg/dL — ABNORMAL HIGH (ref 70–99)

## 2021-06-06 LAB — GLUCOSE, RANDOM: Glucose, Bld: 231 mg/dL — ABNORMAL HIGH (ref 70–99)

## 2021-06-06 LAB — RESP PANEL BY RT-PCR (FLU A&B, COVID) ARPGX2
Influenza A by PCR: NEGATIVE
Influenza B by PCR: NEGATIVE
SARS Coronavirus 2 by RT PCR: NEGATIVE

## 2021-06-06 LAB — ACETAMINOPHEN LEVEL: Acetaminophen (Tylenol), Serum: <10 ug/mL — ABNORMAL LOW (ref 10–30)

## 2021-06-06 LAB — MRSA NEXT GEN BY PCR, NASAL: MRSA by PCR Next Gen: NOT DETECTED

## 2021-06-06 LAB — MAGNESIUM: Magnesium: 2.5 mg/dL — ABNORMAL HIGH (ref 1.7–2.4)

## 2021-06-06 MED ORDER — LACTATED RINGERS IV SOLN: INTRAVENOUS | Status: DC

## 2021-06-06 MED ORDER — PANTOPRAZOLE 2 MG/ML SUSPENSION
40.0000 mg | Freq: Every day | ORAL | Status: DC
  Filled 2021-06-06: qty 20

## 2021-06-06 MED ORDER — LABETALOL HCL 5 MG/ML IV SOLN
10.0000 mg | INTRAVENOUS | Status: DC | PRN
  Administered 2021-06-08 – 2021-06-09 (×5): 10 mg via INTRAVENOUS
  Filled 2021-06-06 (×4): qty 4

## 2021-06-06 MED ORDER — SODIUM CHLORIDE 0.9 % IV SOLN
250.0000 mL | INTRAVENOUS | Status: DC
  Administered 2021-06-06 – 2021-06-07 (×2): 250 mL via INTRAVENOUS

## 2021-06-06 MED ORDER — PANTOPRAZOLE SODIUM 40 MG IV SOLR
40.0000 mg | Freq: Every day | INTRAVENOUS | Status: DC
  Administered 2021-06-06 – 2021-06-07 (×2): 40 mg via INTRAVENOUS
  Filled 2021-06-06 (×2): qty 10

## 2021-06-06 MED ORDER — PROPOFOL 1000 MG/100ML IV EMUL
0.0000 ug/kg/min | INTRAVENOUS | Status: DC
  Administered 2021-06-06: 30 ug/kg/min via INTRAVENOUS
  Administered 2021-06-06: 20 ug/kg/min via INTRAVENOUS
  Administered 2021-06-07: 40 ug/kg/min via INTRAVENOUS
  Filled 2021-06-06 (×3): qty 100

## 2021-06-06 MED ORDER — POLYETHYLENE GLYCOL 3350 17 G PO PACK: 17.0000 g | PACK | Freq: Every day | ORAL | Status: DC

## 2021-06-06 MED ORDER — DEXTROSE 50 % IV SOLN: 25.0000 g | INTRAVENOUS | Status: AC

## 2021-06-06 MED ORDER — ACETAMINOPHEN 325 MG PO TABS
650.0000 mg | ORAL_TABLET | ORAL | Status: DC | PRN
  Administered 2021-06-07: 650 mg via ORAL
  Filled 2021-06-06: qty 2

## 2021-06-06 MED ORDER — CLONAZEPAM 1 MG PO TABS
1.0000 mg | ORAL_TABLET | Freq: Two times a day (BID) | ORAL | Status: DC
  Administered 2021-06-06 – 2021-06-07 (×2): 1 mg
  Filled 2021-06-06 (×2): qty 1

## 2021-06-06 MED ORDER — SODIUM CHLORIDE 0.9 % IV SOLN
1.0000 g | INTRAVENOUS | Status: DC
  Administered 2021-06-06 – 2021-06-07 (×2): 1 g via INTRAVENOUS
  Filled 2021-06-06: qty 10
  Filled 2021-06-06 (×2): qty 1

## 2021-06-06 MED ORDER — FENTANYL CITRATE PF 50 MCG/ML IJ SOSY
PREFILLED_SYRINGE | INTRAMUSCULAR | Status: AC
  Filled 2021-06-06: qty 2

## 2021-06-06 MED ORDER — DEXTROSE 50 % IV SOLN
12.5000 g | INTRAVENOUS | Status: AC
  Administered 2021-06-06: 12.5 g via INTRAVENOUS
  Filled 2021-06-06: qty 50

## 2021-06-06 MED ORDER — FENTANYL CITRATE PF 50 MCG/ML IJ SOSY
50.0000 ug | PREFILLED_SYRINGE | INTRAMUSCULAR | Status: AC | PRN
  Administered 2021-06-06 – 2021-06-07 (×3): 50 ug via INTRAVENOUS
  Filled 2021-06-06 (×3): qty 1

## 2021-06-06 MED ORDER — CHLORHEXIDINE GLUCONATE CLOTH 2 % EX PADS
6.0000 | MEDICATED_PAD | Freq: Every day | CUTANEOUS | Status: DC
  Administered 2021-06-06 – 2021-06-09 (×4): 6 via TOPICAL

## 2021-06-06 MED ORDER — ENOXAPARIN SODIUM 40 MG/0.4ML IJ SOSY: 40.0000 mg | PREFILLED_SYRINGE | INTRAMUSCULAR | Status: DC

## 2021-06-06 MED ORDER — TICAGRELOR 90 MG PO TABS: 90.0000 mg | ORAL_TABLET | Freq: Two times a day (BID) | ORAL | Status: DC

## 2021-06-06 MED ORDER — ROCURONIUM BROMIDE 10 MG/ML (PF) SYRINGE
PREFILLED_SYRINGE | INTRAVENOUS | Status: AC
  Administered 2021-06-06: 100 mg via INTRAVENOUS
  Filled 2021-06-06: qty 10

## 2021-06-06 MED ORDER — DEXTROSE 50 % IV SOLN
25.0000 mL | Freq: Once | INTRAVENOUS | Status: AC
  Administered 2021-06-06: 25 mL via INTRAVENOUS
  Filled 2021-06-06: qty 50

## 2021-06-06 MED ORDER — FENTANYL CITRATE PF 50 MCG/ML IJ SOSY
100.0000 ug | PREFILLED_SYRINGE | Freq: Once | INTRAMUSCULAR | Status: AC
  Administered 2021-06-06: 100 ug via INTRAVENOUS

## 2021-06-06 MED ORDER — DEXTROSE 50 % IV SOLN
INTRAVENOUS | Status: AC
  Administered 2021-06-06: 25 g via INTRAVENOUS
  Filled 2021-06-06: qty 50

## 2021-06-06 MED ORDER — HEPARIN BOLUS VIA INFUSION
3600.0000 [IU] | Freq: Once | INTRAVENOUS | Status: AC
  Administered 2021-06-06: 3600 [IU] via INTRAVENOUS
  Filled 2021-06-06: qty 3600

## 2021-06-06 MED ORDER — ROCURONIUM BROMIDE 50 MG/5ML IV SOLN: 100.0000 mg | Freq: Once | INTRAVENOUS | Status: AC

## 2021-06-06 MED ORDER — INSULIN ASPART 100 UNIT/ML IJ SOLN
0.0000 [IU] | INTRAMUSCULAR | Status: DC
  Administered 2021-06-09 (×2): 2 [IU] via SUBCUTANEOUS
  Filled 2021-06-06 (×3): qty 1

## 2021-06-06 MED ORDER — DOCUSATE SODIUM 50 MG/5ML PO LIQD
100.0000 mg | Freq: Every day | ORAL | Status: DC
  Filled 2021-06-06: qty 10

## 2021-06-06 MED ORDER — POLYETHYLENE GLYCOL 3350 17 G PO PACK: 17.0000 g | PACK | Freq: Every day | ORAL | Status: DC | PRN

## 2021-06-06 MED ORDER — FENTANYL CITRATE PF 50 MCG/ML IJ SOSY: 50.0000 ug | PREFILLED_SYRINGE | INTRAMUSCULAR | Status: DC | PRN

## 2021-06-06 MED ORDER — ONDANSETRON HCL 4 MG/2ML IJ SOLN: 4.0000 mg | Freq: Four times a day (QID) | INTRAMUSCULAR | Status: DC | PRN

## 2021-06-06 MED ORDER — DOCUSATE SODIUM 50 MG/5ML PO LIQD: 100.0000 mg | Freq: Two times a day (BID) | ORAL | Status: DC

## 2021-06-06 MED ORDER — DEXTROSE IN LACTATED RINGERS 5 % IV SOLN: INTRAVENOUS | Status: DC

## 2021-06-06 MED ORDER — NOREPINEPHRINE 4 MG/250ML-% IV SOLN: 0.0000 ug/min | INTRAVENOUS | Status: DC

## 2021-06-06 MED ORDER — NOREPINEPHRINE 4 MG/250ML-% IV SOLN: 2.0000 ug/min | INTRAVENOUS | Status: DC

## 2021-06-06 MED ORDER — HEPARIN (PORCINE) 25000 UT/250ML-% IV SOLN
1500.0000 [IU]/h | INTRAVENOUS | Status: DC
  Administered 2021-06-06: 700 [IU]/h via INTRAVENOUS
  Administered 2021-06-07: 1100 [IU]/h via INTRAVENOUS
  Administered 2021-06-08: 1500 [IU]/h via INTRAVENOUS
  Filled 2021-06-06 (×3): qty 250

## 2021-06-06 MED ORDER — NOREPINEPHRINE 4 MG/250ML-% IV SOLN
INTRAVENOUS | Status: AC
Start: 2021-06-06 — End: 2021-06-06
  Administered 2021-06-06: 2 ug/min via INTRAVENOUS
  Filled 2021-06-06: qty 250

## 2021-06-06 MED ORDER — DOCUSATE SODIUM 100 MG PO CAPS: 100.0000 mg | ORAL_CAPSULE | Freq: Two times a day (BID) | ORAL | Status: DC | PRN

## 2021-06-06 MED ORDER — LACTATED RINGERS IV BOLUS
2000.0000 mL | Freq: Once | INTRAVENOUS | Status: AC
  Administered 2021-06-06: 2000 mL via INTRAVENOUS

## 2021-06-06 NOTE — ED Provider Notes (Signed)
? ?[facility] ?Provider Note ? ? ? Event Date/Time  ? First MD Initiated Contact with Patient 06/06/21 0840   ?  (approximate) ? ? ?History  ? ?Drug Overdose and unresponsive ? ? ?HPI ? ?Nicole Ayala is a 56 y.o. female with a past medical history of RA, CAD, HTN, HDL, GERD, kidney stones who presents via EMS for evaluation with concerns for an overdose.  Seems bystanders called EMS when patient was noted to be unresponsive.  Initial report was that she was in a car and was hypoxic and unresponsive.  She was given some Narcan with minimal improvement in her mental status and was transported on nonrebreather.  She was known to have a bottle of clonazepam with her and some other drug paraphernalia.  No other history is available from EMS.  Patient is nonverbal on arrival and unable provide any other significant history. ? ?  ?Past Medical History:  ?Diagnosis Date  ? Abnormal EKG   ? GERD (gastroesophageal reflux disease)   ? Hot flashes   ? Hypercholesteremia   ? Hypertension   ? Kidney stones   ? Low back pain   ? MI (myocardial infarction) (HCC)   ? Rheumatoid arthritis (HCC)   ? Vitamin D deficiency   ? ? ? ?Physical Exam  ?Triage Vital Signs: ?ED Triage Vitals  ?Enc Vitals Group  ?   BP 06/06/21 0835 115/67  ?   Pulse Rate 06/06/21 0835 76  ?   Resp 06/06/21 0835 16  ?   Temp 06/06/21 0835 97.6 ?F (36.4 ?C)  ?   Temp Source 06/06/21 0835 Axillary  ?   SpO2 06/06/21 0835 100 %  ?   Weight 06/06/21 0840 136 lb (61.7 kg)  ?   Height 06/06/21 0840 4\' 11"  (1.499 m)  ?   Head Circumference --   ?   Peak Flow --   ?   Pain Score --   ?   Pain Loc --   ?   Pain Edu? --   ?   Excl. in GC? --   ? ? ?Most recent vital signs: ?Vitals:  ? 06/06/21 0835 06/06/21 0837  ?BP: 115/67   ?Pulse: 76   ?Resp: 16   ?Temp: 97.6 ?F (36.4 ?C)   ?SpO2: 100% 100%  ? ? ?General: Ill-appearing.  Patient moans to noxious stimuli but otherwise does not interact. ?CV:  Good peripheral perfusion.  2+ radial pulses.  No  murmur. ?Resp:  Normal effort.  Clear bilaterally ?Abd:  No distention.  Soft. ?Other:  Pupils 3 mm reactive to light bilaterally and symmetric.  No obvious trauma to the face scalp head or neck.  There is a less than 0.5 cm abrasion over the left lower leg without any other obvious trauma to the extremities.  Patient withdraws all extremities to noxious stimuli.  She does not otherwise engage with this examiner. ? ? ?ED Results / Procedures / Treatments  ?Labs ?(all labs ordered are listed, but only abnormal results are displayed) ?Labs Reviewed  ?CBC WITH DIFFERENTIAL/PLATELET - Abnormal; Notable for the following components:  ?    Result Value  ? Neutro Abs 8.0 (*)   ? All other components within normal limits  ?COMPREHENSIVE METABOLIC PANEL - Abnormal; Notable for the following components:  ? Glucose, Bld 398 (*)   ? Creatinine, Ser 1.05 (*)   ? All other components within normal limits  ?ACETAMINOPHEN LEVEL - Abnormal; Notable for the following components:  ?  Acetaminophen (Tylenol), Serum <10 (*)   ? All other components within normal limits  ?BLOOD GAS, VENOUS - Abnormal; Notable for the following components:  ? pH, Ven 7.19 (*)   ? pCO2, Ven 72 (*)   ? Acid-base deficit 2.4 (*)   ? All other components within normal limits  ?MAGNESIUM - Abnormal; Notable for the following components:  ? Magnesium 2.5 (*)   ? All other components within normal limits  ?SALICYLATE LEVEL - Abnormal; Notable for the following components:  ? Salicylate Lvl <7.0 (*)   ? All other components within normal limits  ?CBG MONITORING, ED - Abnormal; Notable for the following components:  ? Glucose-Capillary 327 (*)   ? All other components within normal limits  ?RESP PANEL BY RT-PCR (FLU A&B, COVID) [id number]  ?ETHANOL  ? ? ? ?EKG ? ?ECG is remarkable sinus rhythm with ventricular rate of 76, QTc interval of 541 without clear evidence of acute ischemia. ? ? ?RADIOLOGY ? ?CT abdomen interpretation without evidence of skull fracture,  intracranial hemorrhage, edema, hydrocephalus, mass effect or evidence of ischemia.  I also viewed radiology interpretation and agree with their findings of soft tissue fullness in the nasopharynx likely normal variant. ? ?Post Intubation chest x-ray interpreted by myself shows ET tube in appropriate position and enteric tube of the GE junction with some very mild pulmonary vascular congestion. ? ?PROCEDURES: ? ?Critical Care performed: Yes, see critical care procedure note(s) ? ?.Critical Care ?Performed by: Gilles Chiquito, MD ?Authorized by: Gilles Chiquito, MD  ? ?Critical care provider statement:  ?  Critical care time (minutes):  30 ?  Critical care was necessary to treat or prevent imminent or life-threatening deterioration of the following conditions:  Respiratory failure ?  Critical care was time spent personally by me on the following activities:  Development of treatment plan with patient or surrogate, discussions with consultants, evaluation of patient's response to treatment, examination of patient, ordering and review of laboratory studies, ordering and review of radiographic studies, ordering and performing treatments and interventions, pulse oximetry, re-evaluation of patient's condition and review of old charts ?Procedure Name: Intubation ?Date/Time: 06/06/2021 10:30 AM ?Performed by: Gilles Chiquito, MD ?Pre-anesthesia Checklist: Suction available, Emergency Drugs available, Timeout performed, Patient being monitored and Patient identified ?Oxygen Delivery Method: Non-rebreather mask ?Preoxygenation: Pre-oxygenation with 100% oxygen ?Induction Type: Rapid sequence ?Tube size: 7.5 mm ?Number of attempts: 1 ?Airway Equipment and Method: Video-laryngoscopy ?Placement Confirmation: ETT inserted through vocal cords under direct vision ?Secured at: 24 cm ?Tube secured with: ETT holder ? ? ? ? ? ?MEDICATIONS ORDERED IN ED: ?Medications  ?fentaNYL (SUBLIMAZE) 50 MCG/ML injection (has no administration in  time range)  ?propofol (DIPRIVAN) 1000 MG/100ML infusion (has no administration in time range)  ?rocuronium [facility]) injection 100 mg (100 mg Intravenous Given 06/06/21 1021)  ?fentaNYL (SUBLIMAZE) injection 100 mcg (100 mcg Intravenous Given 06/06/21 1028)  ? ? ? ?IMPRESSION / MDM / ASSESSMENT AND PLAN / ED COURSE  ?I reviewed the triage vital signs and the nursing notes. ?             ?               ? 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  Additional considerations include opioids although she did not seem to have any response with EMS and on arrival here she was placed on 2 L nasal  cannula and has no evidence of hypoxia.  He was reportedly found down so we will obtain a CT head to rule out an acute intracranial hemorrhage or skull fracture injury or mass effect.  We will also check basic labs to assess for acute anemia and metabolic derangements, and ethanol intoxication.  Other than very small abrasion of the left lower leg that does not require closure at this time no other obvious evidence of trauma. ? ?ECG is remarkable sinus rhythm with ventricular rate of 76, QTc interval of 541 without clear evidence of acute ischemia. ? ?CT abdomen interpretation without evidence of skull fracture, intracranial hemorrhage, edema, hydrocephalus, mass effect or evidence of ischemia.  I also viewed radiology interpretation and agree with their findings of soft tissue fullness in the nasopharynx likely normal variant. ? ?CBC without leukocytosis or acute anemia.  CMP remarkable for glucose of 398 without any other significant electrolyte or metabolic derangements.  No evidence of metabolic acidosis.  Serum ethanol and acetaminophen level undetectable.  Magnesium 2.5.  VBG with a pH of 7.19 with a PCO2 of 72 bicarb of 27.6 consistent with acute hypercarbic respiratory acidosis.  Patient intubated for  respiratory failure.  Discussed with ICU physician will place admission orders.  Intubation note per procedure note above.  Postintubation x-ray ordered. ? ?Post Intubation chest x-ray interpreted by myself shows ET tub

## 2021-06-06 NOTE — Plan of Care (Addendum)
VSS. Febrile overnight; Tmax 101F. NP Rust-Chester notified. Pt intermittently agitated; PRN fentanyl given x3, remains on prop gtt, and scheduled clonazepam restarted per NP Rust-Chester. Pt not following commands but purposefully moving all extremities. Pt having thick in line and oral secretions. Pt hypoglycemic (CBG 60) x2; PRN D50 given x2 to good effect. D5LR infusion started @20ml /hr per orders. Troponin continues to increase; hep bolus and gtt started per orders. TF started @20ml /hr per orders. ?Problem: Education: ?Goal: Knowledge of General Education information will improve ?Description: Including pain rating scale, medication(s)/side effects and non-pharmacologic comfort measures ?Outcome: Progressing ?  ?Problem: Health Behavior/Discharge Planning: ?Goal: Ability to manage health-related needs will improve ?Outcome: Progressing ?  ?Problem: Clinical Measurements: ?Goal: Ability to maintain clinical measurements within normal limits will improve ?Outcome: Progressing ?Goal: Will remain free from infection ?Outcome: Progressing ?Goal: Diagnostic test results will improve ?Outcome: Progressing ?Goal: Respiratory complications will improve ?Outcome: Progressing ?Goal: Cardiovascular complication will be avoided ?Outcome: Progressing ?  ?Problem: Activity: ?Goal: Risk for activity intolerance will decrease ?Outcome: Progressing ?  ?Problem: Nutrition: ?Goal: Adequate nutrition will be maintained ?Outcome: Progressing ?  ?Problem: Coping: ?Goal: Level of anxiety will decrease ?Outcome: Progressing ?  ?Problem: Elimination: ?Goal: Will not experience complications related to bowel motility ?Outcome: Progressing ?Goal: Will not experience complications related to urinary retention ?Outcome: Progressing ?  ?Problem: Pain Managment: ?Goal: General experience of comfort will improve ?Outcome: Progressing ?  ?Problem: Safety: ?Goal: Ability to remain free from injury will improve ?Outcome: Progressing ?  ?Problem:  Skin Integrity: ?Goal: Risk for impaired skin integrity will decrease ?Outcome: Progressing ?  ?Problem: Activity: ?Goal: Ability to tolerate increased activity will improve ?Outcome: Progressing ?  ?Problem: Respiratory: ?Goal: Ability to maintain a clear airway and adequate ventilation will improve ?Outcome: Progressing ?  ?Problem: Role Relationship: ?Goal: Method of communication will improve ?Outcome: Progressing ?  ?

## 2021-06-06 NOTE — Progress Notes (Signed)
Able to reach pt's son Oluwatomisin Timblin via telephone to update him on his mother's status requiring intubation for airway protection due to suspected drug overdose.  All questions answered to his satisfaction.  He is appreciative of update. ? ? ? ? ?Darel Hong, AGACNP-BC ?[facility] ?Prefer epic messenger for cross cover needs ?If after hours, please call E-link ? ?

## 2021-06-06 NOTE — Consult Note (Signed)
ANTICOAGULATION CONSULT NOTE  ? ?Pharmacy Consult for Heparin ?Indication: chest pain/ACS ? ?No Known Allergies ? ?Patient Measurements: ?Height: 4\' 11"  (149.9 cm) ?Weight: 60 kg (132 lb 4.4 oz) ?IBW/kg (Calculated) : 43.2 ?Heparin Dosing Weight: 60 kg ? ?Vital Signs: ?Temp: 95.9 ?F (35.5 ?C) (04/17 1730) ?Temp Source: Axillary (04/17 1212) ?BP: 154/89 (04/17 1830) ?Pulse Rate: 75 (04/17 1800) ? ?Labs: ?Recent Labs  ?  06/06/21 ?0838 06/06/21 ?1500 06/06/21 ?1642 06/06/21 ?1923  ?HGB 14.3  --   --   --   ?HCT 45.3  --   --   --   ?PLT 222  --   --   --   ?CREATININE 1.05*  --   --   --   ?TROPONINIHS  --  466* 638* 773*  ? ? ?Estimated Creatinine Clearance: 46.6 mL/min (A) (by C-G formula based on SCr of 1.05 mg/dL (H)). ? ? ?Medical History: ?Past Medical History:  ?Diagnosis Date  ? Abnormal EKG   ? GERD (gastroesophageal reflux disease)   ? Hot flashes   ? Hypercholesteremia   ? Hypertension   ? Kidney stones   ? Low back pain   ? MI (myocardial infarction) (HCC)   ? Rheumatoid arthritis (HCC)   ? Vitamin D deficiency   ? ? ?Medications:  ?Medications Prior to Admission  ?Medication Sig Dispense Refill Last Dose  ? clonazePAM (KLONOPIN) 1 MG tablet Take 1 mg by mouth 2 (two) times daily.   06/06/2021  ? lisinopril-hydrochlorothiazide (PRINZIDE,ZESTORETIC) 20-12.5 MG tablet Take 1 tablet by mouth daily. 30 tablet 2   ? ticagrelor (BRILINTA) 90 MG TABS tablet Take 1 tablet (90 mg total) by mouth 2 (two) times daily. (Patient not taking: Reported on 06/06/2021) 60 tablet 2 Not Taking  ? ?Scheduled:  ? [START ON 06/07/2021] Chlorhexidine Gluconate Cloth  6 each Topical Q0600  ? docusate  100 mg Per Tube Daily  ? heparin  3,600 Units Intravenous Once  ? insulin aspart  0-15 Units Subcutaneous Q4H  ? pantoprazole (PROTONIX) IV  40 mg Intravenous QHS  ? polyethylene glycol  17 g Per Tube Daily  ? ?Infusions:  ? sodium chloride 250 mL (06/06/21 1430)  ? cefTRIAXone (ROCEPHIN)  IV 1 g (06/06/21 1624)  ? heparin    ? lactated  ringers 75 mL/hr at 06/06/21 1518  ? norepinephrine (LEVOPHED) Adult infusion Stopped (06/06/21 1130)  ? propofol (DIPRIVAN) infusion 30 mcg/kg/min (06/06/21 2025)  ? ?PRN: acetaminophen, fentaNYL (SUBLIMAZE) injection, fentaNYL (SUBLIMAZE) injection, ondansetron (ZOFRAN) IV, polyethylene glycol ?Anti-infectives (From admission, onward)  ? ? Start     Dose/Rate Route Frequency Ordered Stop  ? 06/06/21 1500  cefTRIAXone (ROCEPHIN) 1 g in sodium chloride 0.9 % 100 mL IVPB       ? 1 g ?200 mL/hr over 30 Minutes Intravenous Every 24 hours 06/06/21 1404    ? ?  ? ? ?Assessment: ?Pharmacy consulted to start heparin for elevated troponin. No DOAC PTA. Cards to see patient tomorrow. Pt not taking ticagrelor, no recent fill hx.  ?  ?Goal of Therapy:  ?Heparin level 0.3-0.7 units/ml ?Monitor platelets by anticoagulation protocol: Yes ?  ?Plan:  ?Give 3600 units bolus x 1 ?Start heparin infusion at 700 units/hr ?Check anti-Xa level in 6 hours and daily while on heparin ?Continue to monitor H&H and platelets ? ?Ronnald Ramp, PharmD, BCPS ?06/06/2021,8:34 PM ? ? ?

## 2021-06-06 NOTE — ED Notes (Signed)
Pt unable to answer triage questions due to being unresponsive on arrival. ?

## 2021-06-06 NOTE — Consult Note (Signed)
PHARMACY CONSULT NOTE - FOLLOW UP ? ?Pharmacy Consult for Electrolyte Monitoring and Replacement  ? ?Recent Labs: ?Potassium (mmol/L)  ?Date Value  ?06/06/2021 4.7  ?12/25/2013 4.1  ? ?Magnesium (mg/dL)  ?Date Value  ?06/06/2021 2.5 (H)  ? ?Calcium (mg/dL)  ?Date Value  ?06/06/2021 8.9  ? ?Calcium, Total (mg/dL)  ?Date Value  ?12/25/2013 9.5  ? ?Albumin (g/dL)  ?Date Value  ?06/06/2021 3.8  ?12/25/2013 3.8  ? ?Phosphorus (mg/dL)  ?Date Value  ?11/19/2017 3.3  ? ?Sodium (mmol/L)  ?Date Value  ?06/06/2021 135  ?12/25/2013 141  ? ? ? ?Assessment: ?56 year old female w/ PMH of  RA, CAD, HTN, HDL presented to ED 06/06/21 via EMS with concerns for overdose after patient found hypoxic and unresponsive. Patient emergently intubated in ED and admitted to ICU. Pharmacy has been consulted to monitor and replace electrolytes as needed. Patient with prolonged Qtc of 541. ? ?Infusions: LR @ 75 mL/hr ? ?Goal of Therapy:  ?Electrolytes WNL ? ?Plan:  ?No electrolytes replacement warranted at this time ?Recheck electrolytes with AM labs  ? ?Sharen Hones, PharmD, BCPS ?Clinical Pharmacist   ?06/06/2021 10:48 AM  ?

## 2021-06-06 NOTE — Progress Notes (Signed)
Elevated Troponin secondary to N-STEMI vs demand ischemia ?PMHx:MI s/p PCI  ?Per Rx investigation, does not appear that she has filled her Brilinta prescription. ?- Trend troponins to peak ?- Echocardiogram ordered ?- heparin drip per pharmacy consult ?- continuous cardiac monitoring ?- Cardiology consulted, appreciate input ? ? ?Nicole Ayala Rust-Chester, AGACNP-BC ?Acute Care Nurse Practitioner ?[facility]  ? ?[phone] / [phone] ?Please see Amion for pager details.  ? ? ?

## 2021-06-06 NOTE — H&P (Signed)
? ?NAME:  Nicole Ayala, MRN:  [id number], DOB:  Apr 17, 1965, LOS: 0 ?ADMISSION DATE:  06/06/2021, CONSULTATION DATE:  06/06/2021 ?REFERRING MD:  Dr. Tamala Julian, CHIEF COMPLAINT:  Unresponsive, suspected Drug Overdose  ? ?Brief Pt Description / Synopsis:  ?56 year old female admitted with acute metabolic encephalopathy and acute hypercapnic respiratory failure in the setting of suspected drug overdose (UDS + for amphetamines) requiring intubation for airway protection.  Also found to have UTI. ? ?History of Present Illness:  ?Nicole Ayala is a 56 year old female with a past medical history significant for RA, coronary artery disease, hypertension, hyperlipidemia, GERD, kidney stones who presented to [facility] ED on 06/06/2021 due to unresponsiveness.  Patient is currently intubated and no family is available, therefore history is obtained from chart review. ? ?Per ED provider, bystanders found the patient unresponsive in a car appearing hypoxic, therefore EMS was dispatched.  EMS administered Narcan with minimal improvement in her mental status.  She was placed on Non-rebreather mask.  She was apparently found with a bottle of clonazepam and other drug paraphernalia.  Upon arrival to the ED she moaned to and withdrew all extremities to noxious stimuli, but otherwise unresponsive, no obvious signs of trauma to face, scalp, head, or neck. ? ?ED Course: ?Initial vital signs: Temperature 97.6 ?F axillary, respiratory rate 16, pulse 76, blood pressure 115/67, SPO2 100% on nonrebreather mask ?Significant Labs: Glucose 398, BUN 19, creatinine 1.05, WBC 9.8, serum acetaminophen less than 10, salicylates less than 7, ethyl alcohol less than 10 ?VBG: pH 7.19/PCO2 72/PO2 33.7/bicarbonate 27.5 ?COVID-19 PCR is pending ?Urine drug screen is positive for amphetamines ?UA concerning for UTI (trace leukocytes, many bacteria and WBC >50). ?Imaging: ?CT Head>>IMPRESSION: ?No acute intracranial abnormality. Bilateral mastoid effusions. ?Soft tissue  fullness for age within the nasopharynx is similar to ?09/16/2018 and therefore likely within normal variation. This could ?be correlated with physical exam. ?Chest x-ray>>FINDINGS: ?ET tube is identified with tip just above the carina (by ?approximately 8 mm). Enteric tube tip and side port are below the ?level of the GE junction in the expected location of the stomach. ?Heart size is normal. Pulmonary vascular congestion without overt ?edema. Subsegmental atelectasis versus scar noted in the left base. ?EKG: sinus rhythm with ventricular rate of 76, QTc interval of 541 without clear evidence of acute ischemia ?Medications administered: 2L LR boluses ? ?She was intubated in the ED for airway protection and hypercapnia. ? ?PCCM is asked to admit. ? ?Pertinent  Medical History  ?CAD ?MI ?Hypertension ?Hyperlipidemia ?Rheumatoid arthritis ?Kidney Stones ?GERD ?Vitamin D Deficiency ? ?Micro Data:  ?4/17: COVID-19 and influenza PCR>>  ?4/17: Urine>> ?4/17: Blood x2 >> ? ?Antimicrobials:  ?Ceftriaxone 4/17>> ? ?Significant Hospital Events: ?Including procedures, antibiotic start and stop dates in addition to other pertinent events   ?4/17: Presented to ED due to suspected drug overdose requiring intubation for airway protection.  PCCM asked to admit ? ?Interim History / Subjective:  ?-Presented to ED unresponsive, required intubation for airway protection ?-Became hypotensive in ED which was fluid responsive ?-After arrival to ICU, noted to be hypothermic (88.5 axillary) ~ placed on warming blanket ?-UDS came back + for amphetamines ?-Urinalysis is concerning for UTI ~ septic workup initiated and placed on Ceftriaxone ? ?Objective   ?Blood pressure 115/67, pulse 76, temperature 97.6 ?F (36.4 ?C), temperature source Axillary, resp. rate 16, height 4' 11" (1.499 m), weight 61.7 kg, SpO2 100 %. ?   ?   ?No intake or output data in the 24 hours ending  06/06/21 1043 ?Filed Weights  ? 06/06/21 0840  ?Weight: 61.7 kg   ? ? ?Examination: ?General: Critically ill-appearing female, laying in bed, intubated and sedated, no acute distress ?HENT: Atraumatic, normocephalic, neck supple, no JVD, orally intubated ?Lungs: Clear breath sounds bilaterally, even, synchronous with the vent, nonlabored ?Cardiovascular: Regular rate and rhythm, S1-S2, no murmurs, rubs, gallops ?Abdomen: Soft, nontender, nondistended, no guarding or rebound tenderness, bowel sounds positive x4 ?Extremities: Normal bulk and tone, no deformities, no edema ?Neuro: Unresponsive, pupils PERRLA ?GU: Foley catheter in place  ?Skin: Scattered ecchymosis throughout ? ?Resolved Hospital Problem list   ? ? ?Assessment & Plan:  ? ?Acute Hypercapnic Respiratory Failure in the setting of suspected Drug overdose ?Intubated for airway protection ?-Full vent support, implement lung protective strategies ?-Plateau pressures less than 30 cm H20 ?-Wean FiO2 & PEEP as tolerated to maintain O2 sats >92% ?-Follow intermittent Chest X-ray & ABG as needed ?-Spontaneous Breathing Trials when respiratory parameters met and mental status permits ?-Implement VAP Bundle ?-Prn Bronchodilators ? ?Brief Hypotension ~ RESOLVED ?PMHx: Hypertension, CAD, MI ?-Continuous cardiac monitoring ?-Maintain MAP >65 ?-IV fluids (received 2 L fluid boluses in ED) ?-Vasopressors as needed to maintain MAP goal ?-Trend lactic acid until normalized ?-Trend HS Troponin until peaked ? ?Suspected Drug Overdose, unclear if unintentional vs intentional ?UDS + for AMPHETAMINES, Pt found with bottle of Clonazepam, and other drug paraphernalia ?-Serum salicylate, tylenol, and Ethyl alcohol all negative ?-Unclear if unintentional vs. Intentional ~ will consult Psych once extubated ? ?UTI ?-Monitor fever curve ?-Trend WBC's & Procalcitonin ?-Follow cultures as above ?-Continue empiric Ceftriaxone pending cultures & sensitivities ? ?Acute Metabolic Encephalopathy in setting of suspected drug overdose and CO2  Narcosis ?Sedation needs in the setting of mechanical ventilation ?-Maintain a RASS goal of 0 to -1 ?-Propofol as needed to maintain RASS goal ?-Avoid sedating medications as able ?-Daily wake up assessment ?-CT Head on arrival negative for acute intracranial abnormality ? ?Hyperglycemia ?-CBG's q4h; Target range of 140 to 180 ?-SSI ?-Follow ICU Hypo/Hyperglycemia protocol ?-Check Hgb A1c ? ? ? ?Best Practice (right click and "Reselect all SmartList Selections" daily)  ? ?Diet/type: NPO ?DVT prophylaxis: LMWH ?GI prophylaxis: PPI ?Lines: N/A ?Foley:  Yes, and it is still needed ?Code Status:  full code ?Last date of multidisciplinary goals of care discussion [N/A] ? ?Labs   ?CBC: ?Recent Labs  ?Lab 06/06/21 ?5056  ?WBC 9.8  ?NEUTROABS 8.0*  ?HGB 14.3  ?HCT 45.3  ?MCV 95.0  ?PLT 222  ? ? ?Basic Metabolic Panel: ?Recent Labs  ?Lab 06/06/21 ?9794  ?NA 135  ?K 4.7  ?CL 99  ?CO2 25  ?GLUCOSE 398*  ?BUN 19  ?CREATININE 1.05*  ?CALCIUM 8.9  ?MG 2.5*  ? ?GFR: ?Estimated Creatinine Clearance: 47.2 mL/min (A) (by C-G formula based on SCr of 1.05 mg/dL (H)). ?Recent Labs  ?Lab 06/06/21 ?8016  ?WBC 9.8  ? ? ?Liver Function Tests: ?Recent Labs  ?Lab 06/06/21 ?5537  ?AST 30  ?ALT 18  ?ALKPHOS 113  ?BILITOT 0.8  ?PROT 7.5  ?ALBUMIN 3.8  ? ?No results for input(s): LIPASE, AMYLASE in the last 168 hours. ?No results for input(s): AMMONIA in the last 168 hours. ? ?ABG ?   ?Component Value Date/Time  ? HCO3 27.5 06/06/2021 0921  ? ACIDBASEDEF 2.4 (H) 06/06/2021 4827  ? O2SAT 33.7 06/06/2021 0921  ?  ? ?Coagulation Profile: ?No results for input(s): INR, PROTIME in the last 168 hours. ? ?Cardiac Enzymes: ?No results for input(s): CKTOTAL, CKMB, CKMBINDEX,  TROPONINI in the last 168 hours. ? ?HbA1C: ?No results found for: HGBA1C ? ?CBG: ?Recent Labs  ?Lab 06/06/21 ?7824  ?GLUCAP 327*  ? ? ?Review of Systems:   ?Unable to assess due to AMS and intubation ? ? ?Past Medical History:  ?She,  has a past medical history of Abnormal EKG, GERD  (gastroesophageal reflux disease), Hot flashes, Hypercholesteremia, Hypertension, Kidney stones, Low back pain, MI (myocardial infarction) ([place]), Rheumatoid arthritis ([place]), and Vitamin D deficiency.  ? ?Surgical History:  ? ?P

## 2021-06-06 NOTE — ED Notes (Addendum)
Nicole Ayala with respiratory at bedside as well as Dr. Katrinka BlazingSmith and [name], RN. ? ?Vital signs  68, 97% 114/70 ?Time out @ 10:20 am ?Rocuronium 100 mg @ 10:21am ?Fentanyl 100 mg @ 10:20 am ? ?Tube in at 10:22 am ?+color change ?25 @ lip with 7.5 tube ?Bil breath sounds ? ?Chest x ray ordered for tube placement. ? ?End vital signs ?160/90 86, 100% ? ?

## 2021-06-06 NOTE — Progress Notes (Signed)
Pt transported from ED 13 to ICU 1 s/RN without difficulty ?

## 2021-06-06 NOTE — Progress Notes (Addendum)
1220 Patient received from ED. Report from [facility]. 1300 OG advanced to 55 cm at the lip. 1315 Ventilator FIO2 increased slowly to 100 %. Consulted with Dr. Carney Bern increased to 8 per verbal order.1400 Patients' oxygen saturations have increased with warming and ventilator FIO2 weaned as needed. 1630 CBG 64. Treated with 60ml. Of D50. Recheck 15 minutes later CBG 112. ?

## 2021-06-06 NOTE — Progress Notes (Signed)
?   06/06/21 1600  ?Clinical Encounter Type  ?Visited With Patient  ?Visit Type Initial  ?Referral From Chaplain  ?Consult/Referral To Chaplain  ? ?Chaplain provided compassionate presence and prayer. No family at bedside. ?

## 2021-06-06 NOTE — ED Triage Notes (Signed)
Pt found unresponsive in a parked car behind a building. Pt was given 2mg  narcan and only became arousable to painful stimuli. Pt with snoring respirations on arrival and on non rebreather mask. ?

## 2021-06-07 ENCOUNTER — Inpatient Hospital Stay: Payer: Medicaid Other

## 2021-06-07 ENCOUNTER — Encounter: Payer: Self-pay | Admitting: Internal Medicine

## 2021-06-07 ENCOUNTER — Inpatient Hospital Stay
Admit: 2021-06-07 | Discharge: 2021-06-07 | Disposition: A | Discharge status: Home / Self Care | Payer: Medicaid Other | Attending: Pulmonary Disease | Admitting: Pulmonary Disease

## 2021-06-07 DIAGNOSIS — J9602 Acute respiratory failure with hypercapnia: Secondary | ICD-10-CM | POA: Diagnosis not present

## 2021-06-07 LAB — CBC
HCT: 35.6 % — ABNORMAL LOW (ref 36.0–46.0)
Hemoglobin: 11.8 g/dL — ABNORMAL LOW (ref 12.0–15.0)
MCH: 31.1 pg (ref 26.0–34.0)
MCHC: 33.1 g/dL (ref 30.0–36.0)
MCV: 93.9 fL (ref 80.0–100.0)
Platelets: 191 10*3/uL (ref 150–400)
RBC: 3.79 MIL/uL — ABNORMAL LOW (ref 3.87–5.11)
RDW: 12.6 % (ref 11.5–15.5)
WBC: 13.4 10*3/uL — ABNORMAL HIGH (ref 4.0–10.5)
nRBC: 0 % (ref 0.0–0.2)

## 2021-06-07 LAB — HEPARIN LEVEL (UNFRACTIONATED)
Heparin Unfractionated: <0.1 IU/mL — ABNORMAL LOW (ref 0.30–0.70)
Heparin Unfractionated: 0.11 IU/mL — ABNORMAL LOW (ref 0.30–0.70)
Heparin Unfractionated: 0.16 IU/mL — ABNORMAL LOW (ref 0.30–0.70)

## 2021-06-07 LAB — BLOOD GAS, ARTERIAL
Acid-base deficit: 0.2 mmol/L (ref 0.0–2.0)
Bicarbonate: 25.4 mmol/L (ref 20.0–28.0)
FIO2: 28 %
MECHVT: 420 mL
Mechanical Rate: 16
O2 Saturation: 96.5 %
PEEP: 5 cmH2O
Patient temperature: 36
pCO2 arterial: 42 mmHg (ref 32–48)
pH, Arterial: 7.38 (ref 7.35–7.45)
pO2, Arterial: 70 mmHg — ABNORMAL LOW (ref 83–108)

## 2021-06-07 LAB — BASIC METABOLIC PANEL
Anion gap: 9 (ref 5–15)
BUN: 17 mg/dL (ref 6–20)
CO2: 25 mmol/L (ref 22–32)
Calcium: 8.5 mg/dL — ABNORMAL LOW (ref 8.9–10.3)
Chloride: 104 mmol/L (ref 98–111)
Creatinine, Ser: 0.83 mg/dL (ref 0.44–1.00)
GFR, Estimated: >60 mL/min (ref >60)
Glucose, Bld: 87 mg/dL (ref 70–99)
Potassium: 3.8 mmol/L (ref 3.5–5.1)
Sodium: 138 mmol/L (ref 135–145)

## 2021-06-07 LAB — GLUCOSE, CAPILLARY
Glucose-Capillary: 138 mg/dL — ABNORMAL HIGH (ref 70–99)
Glucose-Capillary: 69 mg/dL — ABNORMAL LOW (ref 70–99)
Glucose-Capillary: 90 mg/dL (ref 70–99)
Glucose-Capillary: 80 mg/dL (ref 70–99)
Glucose-Capillary: 73 mg/dL (ref 70–99)
Glucose-Capillary: 77 mg/dL (ref 70–99)
Glucose-Capillary: 85 mg/dL (ref 70–99)
Glucose-Capillary: 79 mg/dL (ref 70–99)

## 2021-06-07 LAB — ECHOCARDIOGRAM COMPLETE
AR max vel: 3.25 cm2
AV Area VTI: 3.59 cm2
AV Area mean vel: 3.29 cm2
AV Mean grad: 4 mmHg
AV Peak grad: 7.1 mmHg
Ao pk vel: 1.34 m/s
Area-P 1/2: 4.08 cm2
Height: 59 in
MV VTI: 3.32 cm2
S' Lateral: 2.6 cm
Weight: 2197.55 oz

## 2021-06-07 LAB — PHOSPHORUS: Phosphorus: 4 mg/dL (ref 2.5–4.6)

## 2021-06-07 LAB — TRIGLYCERIDES: Triglycerides: 54 mg/dL (ref <150)

## 2021-06-07 LAB — HEMOGLOBIN A1C
Hgb A1c MFr Bld: 5.4 % (ref 4.8–5.6)
Mean Plasma Glucose: 108.28 mg/dL

## 2021-06-07 LAB — BRAIN NATRIURETIC PEPTIDE: B Natriuretic Peptide: 30.2 pg/mL (ref 0.0–100.0)

## 2021-06-07 LAB — TROPONIN I (HIGH SENSITIVITY)
Troponin I (High Sensitivity): 1118 ng/L (ref <18)
Troponin I (High Sensitivity): 930 ng/L (ref <18)

## 2021-06-07 LAB — MAGNESIUM: Magnesium: 1.7 mg/dL (ref 1.7–2.4)

## 2021-06-07 LAB — PROCALCITONIN: Procalcitonin: 0.27 ng/mL

## 2021-06-07 MED ORDER — ACETAMINOPHEN 325 MG PO TABS
650.0000 mg | ORAL_TABLET | ORAL | Status: DC | PRN
  Administered 2021-06-08 (×2): 650 mg
  Filled 2021-06-07 (×2): qty 2

## 2021-06-07 MED ORDER — POTASSIUM CHLORIDE 20 MEQ PO PACK: 40.0000 meq | PACK | Freq: Once | ORAL | Status: DC

## 2021-06-07 MED ORDER — DEXTROSE 50 % IV SOLN
12.5000 g | INTRAVENOUS | Status: AC
  Administered 2021-06-07: 12.5 g via INTRAVENOUS

## 2021-06-07 MED ORDER — HEPARIN BOLUS VIA INFUSION
1800.0000 [IU] | Freq: Once | INTRAVENOUS | Status: AC
  Administered 2021-06-07: 1800 [IU] via INTRAVENOUS
  Filled 2021-06-07: qty 1800

## 2021-06-07 MED ORDER — MAGNESIUM SULFATE IN D5W 1-5 GM/100ML-% IV SOLN
1.0000 g | Freq: Once | INTRAVENOUS | Status: AC
  Administered 2021-06-07: 1 g via INTRAVENOUS
  Filled 2021-06-07 (×2): qty 100

## 2021-06-07 MED ORDER — DEXMEDETOMIDINE HCL IN NACL 400 MCG/100ML IV SOLN
0.4000 ug/kg/h | INTRAVENOUS | Status: DC
  Administered 2021-06-07: 0.4 ug/kg/h via INTRAVENOUS

## 2021-06-07 MED ORDER — MAGNESIUM SULFATE 2 GM/50ML IV SOLN
2.0000 g | Freq: Once | INTRAVENOUS | Status: DC
  Filled 2021-06-07: qty 50

## 2021-06-07 MED ORDER — HEPARIN BOLUS VIA INFUSION
2000.0000 [IU] | Freq: Once | INTRAVENOUS | Status: AC
  Administered 2021-06-07: 2000 [IU] via INTRAVENOUS
  Filled 2021-06-07: qty 2000

## 2021-06-07 MED ORDER — POLYETHYLENE GLYCOL 3350 17 G PO PACK: 17.0000 g | PACK | Freq: Every day | ORAL | Status: DC | PRN

## 2021-06-07 MED ORDER — VITAL HIGH PROTEIN PO LIQD
1000.0000 mL | ORAL | Status: DC
  Administered 2021-06-07: 1000 mL

## 2021-06-07 MED ORDER — MAGNESIUM SULFATE IN D5W 1-5 GM/100ML-% IV SOLN
1.0000 g | Freq: Once | INTRAVENOUS | Status: AC
  Administered 2021-06-07: 1 g via INTRAVENOUS
  Filled 2021-06-07: qty 100

## 2021-06-07 NOTE — Plan of Care (Signed)
VSS. Afebrile. O2 requirement weaned to RA w/ O2 sat upper 90s. Pt intermittently following commands. Drowsy; responds to voice. Irritable/restless when awake. ANOx1. Moves all extremeties. Continues on hep gtt. Foley intact with good urine output.   ?Problem: Education: ?Goal: Knowledge of General Education information will improve ?Description: Including pain rating scale, medication(s)/side effects and non-pharmacologic comfort measures ?Outcome: Progressing ?  ?Problem: Health Behavior/Discharge Planning: ?Goal: Ability to manage health-related needs will improve ?Outcome: Progressing ?  ?Problem: Clinical Measurements: ?Goal: Ability to maintain clinical measurements within normal limits will improve ?Outcome: Progressing ?Goal: Will remain free from infection ?Outcome: Progressing ?Goal: Diagnostic test results will improve ?Outcome: Progressing ?Goal: Respiratory complications will improve ?Outcome: Progressing ?Goal: Cardiovascular complication will be avoided ?Outcome: Progressing ?  ?Problem: Activity: ?Goal: Risk for activity intolerance will decrease ?Outcome: Progressing ?  ?Problem: Nutrition: ?Goal: Adequate nutrition will be maintained ?Outcome: Progressing ?  ?Problem: Coping: ?Goal: Level of anxiety will decrease ?Outcome: Progressing ?  ?Problem: Elimination: ?Goal: Will not experience complications related to bowel motility ?Outcome: Progressing ?Goal: Will not experience complications related to urinary retention ?Outcome: Progressing ?  ?Problem: Pain Managment: ?Goal: General experience of comfort will improve ?Outcome: Progressing ?  ?Problem: Safety: ?Goal: Ability to remain free from injury will improve ?Outcome: Progressing ?  ?Problem: Skin Integrity: ?Goal: Risk for impaired skin integrity will decrease ?Outcome: Progressing ?  ?Problem: Activity: ?Goal: Ability to tolerate increased activity will improve ?Outcome: Progressing ?  ?Problem: Respiratory: ?Goal: Ability to maintain a clear  airway and adequate ventilation will improve ?Outcome: Progressing ?  ?Problem: Role Relationship: ?Goal: Method of communication will improve ?Outcome: Progressing ?  ?

## 2021-06-07 NOTE — Progress Notes (Signed)
*  PRELIMINARY RESULTS* ?Echocardiogram ?2D Echocardiogram has been performed. ? ?Kalene Cutler, Dorene Sorrow ?06/07/2021, 8:52 AM ?

## 2021-06-07 NOTE — Progress Notes (Addendum)
0730 Wake up assessment done. Patient combative early but following commands. MAE without problems. Placed on spontaneous mode on ventilator 35% 5 of PeeP. Patient did well. Patient following commands. [id number] Patient extubated to 4 L nasal cannula. Patient able to speak but turned over on her right side and went to sleep.1200 Patient refused mouth care all day. 1800 Patient refused mouth care all day. She answered when her name called but refused to interact with nurse. States she is at home. ?

## 2021-06-07 NOTE — Progress Notes (Signed)
? ?NAME:  Nicole Ayala, MRN:  [id number], DOB:  16-Aug-1965, LOS: 1 ?ADMISSION DATE:  06/06/2021, CONSULTATION DATE:  06/06/2021 ?REFERRING MD:  Dr. Katrinka Blazing, CHIEF COMPLAINT:  Unresponsive, suspected Drug Overdose  ? ?Brief Pt Description / Synopsis:  ?56 year old female admitted with acute metabolic encephalopathy and acute hypercapnic respiratory failure in the setting of suspected drug overdose (UDS + for amphetamines) requiring intubation for airway protection.  Also found to have UTI. ? ?History of Present Illness:  ?Nicole Ayala is a 56 year old female with a past medical history significant for RA, coronary artery disease, hypertension, hyperlipidemia, GERD, kidney stones who presented to [facility] ED on 06/06/2021 due to unresponsiveness.  Patient is currently intubated and no family is available, therefore history is obtained from chart review. ? ?Per ED provider, bystanders found the patient unresponsive in a car appearing hypoxic, therefore EMS was dispatched.  EMS administered Narcan with minimal improvement in her mental status.  She was placed on Non-rebreather mask.  She was apparently found with a bottle of clonazepam and other drug paraphernalia.  Upon arrival to the ED she moaned to and withdrew all extremities to noxious stimuli, but otherwise unresponsive, no obvious signs of trauma to face, scalp, head, or neck. ? ?ED Course: ?Initial vital signs: Temperature 97.6 ?F axillary, respiratory rate 16, pulse 76, blood pressure 115/67, SPO2 100% on nonrebreather mask ?Significant Labs: Glucose 398, BUN 19, creatinine 1.05, WBC 9.8, serum acetaminophen less than 10, salicylates less than 7, ethyl alcohol less than 10 ?VBG: pH 7.19/PCO2 72/PO2 33.7/bicarbonate 27.5 ?COVID-19 PCR is pending ?Urine drug screen is positive for amphetamines ?UA concerning for UTI (trace leukocytes, many bacteria and WBC >50). ?Imaging: ?CT Head>>IMPRESSION: ?No acute intracranial abnormality. Bilateral mastoid effusions. ?Soft tissue  fullness for age within the nasopharynx is similar to ?09/16/2018 and therefore likely within normal variation. This could ?be correlated with physical exam. ?Chest x-ray>>FINDINGS: ?ET tube is identified with tip just above the carina (by ?approximately 8 mm). Enteric tube tip and side port are below the ?level of the GE junction in the expected location of the stomach. ?Heart size is normal. Pulmonary vascular congestion without overt ?edema. Subsegmental atelectasis versus scar noted in the left base. ?EKG: sinus rhythm with ventricular rate of 76, QTc interval of 541 without clear evidence of acute ischemia ?Medications administered: 2L LR boluses ? ?She was intubated in the ED for airway protection and hypercapnia. ? ?PCCM is asked to admit. ? ?Pertinent  Medical History  ?CAD ?MI ?Hypertension ?Hyperlipidemia ?Rheumatoid arthritis ?Kidney Stones ?GERD ?Vitamin D Deficiency ? ?Micro Data:  ?4/17: COVID-19 and influenza PCR>>  ?4/17: Urine>> ?4/17: Blood x2 >> ? ?Antimicrobials:  ?Ceftriaxone 4/17>> ? ?Significant Hospital Events: ?Including procedures, antibiotic start and stop dates in addition to other pertinent events   ?4/17: Presented to ED due to suspected drug overdose requiring intubation for airway protection.  PCCM asked to admit ?4/18: Placed on Heparin gtt overnight with Cardiology consult due to elevated troponin. Miminal vent settings, remains encephalopathic when sedation lowered, Precedex started.  EXTUBATED ? ?Interim History / Subjective:  ?-Overnight with low grade fever, T max 100.8 ?-New Leukocytosis this am, WBC 13.4 (9.8) ~ blood and urine cultures still pending ?-Heparin gtt started overnight due to elevated Troponin (peaked at 1118), Cardiology consulted, ECHO pending ?-BP soft with SBP 90's (MAP >65) ?-Weaned down to 35% FiO2 & 5 PEEP ?-Encephalopathic (agitated/restless/reaching for ETT) when sedation off ~ will start Precedex and resume home Clonazepam ?-If mentation improves will  perform  SBT ~ EXTUBATED ? ?Objective   ?Blood pressure (!) 92/58, pulse 80, temperature 100.2 ?F (37.9 ?C), resp. rate 16, height 4\' 11"  (1.499 m), weight 62.3 kg, SpO2 97 %. ?   ?Vent Mode: PRVC ?FiO2 (%):  [28 %-100 %] 40 % ?Set Rate:  [16 bmp] 16 bmp ?Vt Set:  [420 mL] 420 mL ?PEEP:  [5 cmH20-8 cmH20] 8 cmH20 ?Plateau Pressure:  [19 cmH20-23 cmH20] 19 cmH20  ? ?Intake/Output Summary (Last 24 hours) at 06/07/2021 0741 ?Last data filed at 06/07/2021 0600 ?Gross per 24 hour  ?Intake 1820.79 ml  ?Output 935 ml  ?Net 885.79 ml  ? ?Filed Weights  ? 06/06/21 0840 06/06/21 1241 06/07/21 0500  ?Weight: 61.7 kg 60 kg 62.3 kg  ? ? ?Examination: ?General: Critically ill-appearing female, laying in bed,sedation off for WUA, intermittent agitated ?HENT: Atraumatic, normocephalic, neck supple, no JVD, orally intubated ?Lungs: Coarse breath sounds bilaterally, even, synchronous with the vent, nonlabored ?Cardiovascular: Regular rate and rhythm, S1-S2, no murmurs, rubs, gallops ?Abdomen: Soft, nontender, nondistended, no guarding or rebound tenderness, bowel sounds positive x4 ?Extremities: Normal bulk and tone, no deformities, no edema ?Neuro: Sedation off for WUA, intermittent agitation/restlessness, moves all extremities but not to command, pupils PERRLA ?GU: Foley catheter in place  ?Skin: Scattered ecchymosis throughout ? ?Resolved Hospital Problem list   ? ? ?Assessment & Plan:  ? ?Acute Hypercapnic Respiratory Failure in the setting of suspected Drug overdose ?Intubated for airway protection, ? Aspiration ~ EXTUBATED 4/18 ?-Supplemental O2 as needed to maintain O2 sats >90% ?-Follow intermittent Chest X-ray & ABG as needed ?-Bronchodilators as needed ?-ABX as above ?-Pulmonary toilet as able ? ?Brief Hypotension ~ RESOLVED ?Elevated Troponin, demand ischemia vs NSTEMI ?PMHx: Hypertension, CAD, MI ?-Continuous cardiac monitoring ?-Maintain MAP >65 ?-IV fluids (received 2 L fluid boluses in ED) ?-Vasopressors as needed to  maintain MAP goal ?-Trend lactic acid until normalized ?-HS Troponin peaked at 1118 ?-Continue Heparin gtt ?-Cardiology consulted, appreciate input ?-Echocardiogram pending ? ?Suspected Drug Overdose, unclear if unintentional vs intentional ?UDS + for AMPHETAMINES, Pt found with bottle of Clonazepam, and other drug paraphernalia ?-Serum salicylate, tylenol, and Ethyl alcohol all negative ?-Unclear if unintentional vs. Intentional ~ will consult Psych now that is extubated ? ?UTI ?? Aspiration ?-Monitor fever curve ?-Trend WBC's & Procalcitonin ?-Follow cultures as above ?-Continue empiric Ceftriaxone pending cultures & sensitivities ? ?Acute Metabolic Encephalopathy in setting of suspected drug overdose and CO2 Narcosis ?Sedation needs in the setting of mechanical ventilation ?-Provide supportive Care ?-Avoid sedating meds as able ?-Preceded if needed ?-CT Head on arrival negative for acute intracranial abnormality ? ?Hyperglycemia ?-CBG's q4h; Target range of 140 to 180 ?-SSI ?-Follow ICU Hypo/Hyperglycemia protocol ?-Hgb A1c is 5.4 ? ? ? ?Best Practice (right click and "Reselect all SmartList Selections" daily)  ? ?Diet/type: NPO ?DVT prophylaxis: Heparin gtt ?GI prophylaxis: PPI ?Lines: N/A ?Foley:  Yes, and it is still needed ?Code Status:  full code ?Last date of multidisciplinary goals of care discussion [06/07/2021] ? ?Labs   ?CBC: ?Recent Labs  ?Lab 06/06/21 ?[id number] 06/07/21 ?0255  ?WBC 9.8 13.4*  ?NEUTROABS 8.0*  --   ?HGB 14.3 11.8*  ?HCT 45.3 35.6*  ?MCV 95.0 93.9  ?PLT 222 191  ? ? ? ?Basic Metabolic Panel: ?Recent Labs  ?Lab 06/06/21 ?[id number] 06/06/21 ?1237 06/07/21 ?0255  ?NA 135  --  138  ?K 4.7  --  3.8  ?CL 99  --  104  ?CO2 25  --  25  ?GLUCOSE 398*  231* 87  ?BUN 19  --  17  ?CREATININE 1.05*  --  0.83  ?CALCIUM 8.9  --  8.5*  ?MG 2.5*  --  1.7  ?PHOS  --   --  4.0  ? ? ?GFR: ?Estimated Creatinine Clearance: 60 mL/min (by C-G formula based on SCr of 0.83 mg/dL). ?Recent Labs  ?Lab 06/06/21 ?6294  06/07/21 ?0255  ?WBC 9.8 13.4*  ? ? ? ?Liver Function Tests: ?Recent Labs  ?Lab 06/06/21 ?0838  ?AST 30  ?ALT 18  ?ALKPHOS 113  ?BILITOT 0.8  ?PROT 7.5  ?ALBUMIN 3.8  ? ? ?No results for input(s): LIPASE, AMYLASE in

## 2021-06-07 NOTE — Consult Note (Signed)
ANTICOAGULATION CONSULT NOTE  ? ?Pharmacy Consult for Heparin ?Indication: chest pain/ACS ? ?No Known Allergies ? ?Patient Measurements: ?Height: 4\' 11"  (149.9 cm) ?Weight: 60 kg (132 lb 4.4 oz) ?IBW/kg (Calculated) : 43.2 ?Heparin Dosing Weight: 60 kg ? ?Vital Signs: ?Temp: 100.8 ?F (38.2 ?C) (04/18 0130) ?Temp Source: Oral (04/18 0130) ?BP: 114/62 (04/18 0200) ?Pulse Rate: 84 (04/18 0200) ? ?Labs: ?Recent Labs  ?  06/06/21 ?0838 06/06/21 ?1500 06/06/21 ?1923 06/06/21 ?2135 06/06/21 ?2353 06/07/21 ?0255  ?HGB 14.3  --   --   --   --  11.8*  ?HCT 45.3  --   --   --   --  35.6*  ?PLT 222  --   --   --   --  191  ?APTT  --   --   --  27  --   --   ?LABPROT  --   --   --  13.1  --   --   ?INR  --   --   --  1.0  --   --   ?HEPARINUNFRC  --   --   --   --   --  <0.10*  ?CREATININE 1.05*  --   --   --   --  0.83  ?TROPONINIHS  --    < > 773* 934* 1,118*  --   ? < > = values in this interval not displayed.  ? ? ? ?Estimated Creatinine Clearance: 58.9 mL/min (by C-G formula based on SCr of 0.83 mg/dL). ? ? ?Medical History: ?Past Medical History:  ?Diagnosis Date  ? Abnormal EKG   ? GERD (gastroesophageal reflux disease)   ? Hot flashes   ? Hypercholesteremia   ? Hypertension   ? Kidney stones   ? Low back pain   ? MI (myocardial infarction) (HCC)   ? Rheumatoid arthritis (HCC)   ? Vitamin D deficiency   ? ? ?Medications:  ?Medications Prior to Admission  ?Medication Sig Dispense Refill Last Dose  ? clonazePAM (KLONOPIN) 1 MG tablet Take 1 mg by mouth 2 (two) times daily.   06/06/2021  ? lisinopril-hydrochlorothiazide (PRINZIDE,ZESTORETIC) 20-12.5 MG tablet Take 1 tablet by mouth daily. 30 tablet 2   ? ticagrelor (BRILINTA) 90 MG TABS tablet Take 1 tablet (90 mg total) by mouth 2 (two) times daily. (Patient not taking: Reported on 06/06/2021) 60 tablet 2 Not Taking  ? ?Scheduled:  ? Chlorhexidine Gluconate Cloth  6 each Topical Q0600  ? clonazePAM  1 mg Per Tube BID  ? docusate  100 mg Per Tube Daily  ? insulin aspart  0-15  Units Subcutaneous Q4H  ? pantoprazole (PROTONIX) IV  40 mg Intravenous QHS  ? polyethylene glycol  17 g Per Tube Daily  ? ?Infusions:  ? sodium chloride 250 mL (06/06/21 1430)  ? cefTRIAXone (ROCEPHIN)  IV 1 g (06/06/21 1624)  ? dextrose 5% lactated ringers 20 mL/hr at 06/07/21 0400  ? heparin 700 Units/hr (06/07/21 0400)  ? lactated ringers 50 mL/hr at 06/07/21 0400  ? norepinephrine (LEVOPHED) Adult infusion Stopped (06/06/21 1130)  ? propofol (DIPRIVAN) infusion 40 mcg/kg/min (06/07/21 0400)  ? ?PRN: acetaminophen, fentaNYL (SUBLIMAZE) injection, labetalol, ondansetron (ZOFRAN) IV, polyethylene glycol ?Anti-infectives (From admission, onward)  ? ? Start     Dose/Rate Route Frequency Ordered Stop  ? 06/06/21 1500  cefTRIAXone (ROCEPHIN) 1 g in sodium chloride 0.9 % 100 mL IVPB       ? 1 g ?200 mL/hr over 30 Minutes Intravenous  Every 24 hours 06/06/21 1404    ? ?  ? ? ?Assessment: ?Pharmacy consulted to start heparin for elevated troponin. No DOAC PTA. Cards to see patient tomorrow. Pt not taking ticagrelor, no recent fill hx.  ?  ?Goal of Therapy:  ?Heparin level 0.3-0.7 units/ml ?Monitor platelets by anticoagulation protocol: Yes ? ?4/18 0255 HL <0.10, subtherapeutic ?  ?Plan:  ?Bolus 1800 units x 1 ?Increase heparin infusion to 900 units.hr ?Recheck HL in 6 hr after rate change ?CBC daily while on heparin ? ?Otelia Sergeant, PharmD, MBA ?06/07/2021 ?4:04 AM ? ? ? ?

## 2021-06-07 NOTE — Progress Notes (Signed)
Initial Nutrition Assessment ? ?DOCUMENTATION CODES:  ? ?Not applicable ? ?INTERVENTION:  ? ?-RD will follow for diet advancement and add supplements as appropriate ? ?NUTRITION DIAGNOSIS:  ? ?Inadequate oral intake related to inability to eat as evidenced by NPO status. ? ?GOAL:  ? ?Patient will meet greater than or equal to 90% of their needs ? ?MONITOR:  ? ?PO intake, Supplement acceptance, Diet advancement, Labs, Weight trends, Skin, I & O's ? ?REASON FOR ASSESSMENT:  ? ?Consult ?Assessment of nutrition requirement/status ? ?ASSESSMENT:  ? ?56 year old female admitted with acute metabolic encephalopathy and acute hypercapnic respiratory failure in the setting of suspected drug overdose (UDS + for amphetamines) requiring intubation for airway protection.  Also found to have UTI. ? ?Pt admitted with acute respiratory failure secondary to suspected drug overdose.  ? ?Reviewed I/O's: +886 ml x 24 hours ? ?UOP: 885 ml x 24 hours ? ?NGT output: 50 ml x 24 hours  ? ?Case discussed with RN; pt was extubated at 0800 and is still sleepy from sedation. Pt did not arouse to voice or touch. She remains NPO.  ? ?Reviewed wt hx; wt has been stable over the past year.  ? ?Medications reviewed and include colace and potassium chloride.  ? ?Lab Results  ?Component Value Date  ? HGBA1C 5.4 06/07/2021  ? PTA DM medications are none.  ? ?Labs reviewed: CBGS: 69-138 (inpatient orders for glycemic control are 0-15 units insulin aspart every 4 hours).   ? ?NUTRITION - FOCUSED PHYSICAL EXAM: ? ?Flowsheet Row Most Recent Value  ?Orbital Region No depletion  ?Upper Arm Region No depletion  ?Thoracic and Lumbar Region No depletion  ?Buccal Region No depletion  ?Temple Region No depletion  ?Clavicle Bone Region No depletion  ?Clavicle and Acromion Bone Region No depletion  ?Scapular Bone Region No depletion  ?Dorsal Hand No depletion  ?Patellar Region Mild depletion  ?Anterior Thigh Region Mild depletion  ?Posterior Calf Region Mild  depletion  ?Edema (RD Assessment) None  ?Hair Reviewed  ?Eyes Reviewed  ?Mouth Reviewed  ?Skin Reviewed  ?Nails Reviewed  ? ?  ? ? ?Diet Order:   ?Diet Order   ? ?       ?  Diet NPO time specified  Diet effective now       ?  ? ?  ?  ? ?  ? ? ?EDUCATION NEEDS:  ? ?Not appropriate for education at this time ? ?Skin:  Skin Assessment: Reviewed RN Assessment ? ?Last BM:  Unknown ? ?Height:  ? ?Ht Readings from Last 1 Encounters:  ?06/06/21 4\' 11"  (1.499 m)  ? ? ?Weight:  ? ?Wt Readings from Last 1 Encounters:  ?06/07/21 62.3 kg  ? ? ?Ideal Body Weight:  44.7 kg ? ?BMI:  Body mass index is 27.74 kg/m?. ? ?Estimated Nutritional Needs:  ? ?Kcal:  1650-1850 ? ?Protein:  80-95 grams ? ?Fluid:  > 1.6 L ? ? ? ?Levada Schilling, RD, LDN, CDCES ?Registered Dietitian II ?Certified Diabetes Care and Education Specialist ?Please refer to [facility] for RD and/or RD on-call/weekend/after hours pager  ?

## 2021-06-07 NOTE — Consult Note (Signed)
ANTICOAGULATION CONSULT NOTE  ? ?Pharmacy Consult for Heparin ?Indication: chest pain/ACS ? ?No Known Allergies ? ?Patient Measurements: ?Height: 4\' 11"  (149.9 cm) ?Weight: 62.3 kg (137 lb 5.6 oz) ?IBW/kg (Calculated) : 43.2 ?Heparin Dosing Weight: 60 kg ? ?Vital Signs: ?BP: 123/58 (04/18 1800) ?Pulse Rate: 85 (04/18 1800) ? ?Labs: ?Recent Labs  ?  06/06/21 ?0838 06/06/21 ?1500 06/06/21 ?2135 06/06/21 ?2353 06/07/21 ?0255 06/07/21 ?1039 06/07/21 ?1923  ?HGB 14.3  --   --   --  11.8*  --   --   ?HCT 45.3  --   --   --  35.6*  --   --   ?PLT 222  --   --   --  191  --   --   ?APTT  --   --  27  --   --   --   --   ?LABPROT  --   --  13.1  --   --   --   --   ?INR  --   --  1.0  --   --   --   --   ?HEPARINUNFRC  --   --   --   --  <0.10* 0.11* 0.16*  ?CREATININE 1.05*  --   --   --  0.83  --   --   ?TROPONINIHS  --    < > 934* 1,118* 930*  --   --   ? < > = values in this interval not displayed.  ? ? ? ?Estimated Creatinine Clearance: 60 mL/min (by C-G formula based on SCr of 0.83 mg/dL). ? ? ?Medical History: ?Past Medical History:  ?Diagnosis Date  ? Abnormal EKG   ? GERD (gastroesophageal reflux disease)   ? Hot flashes   ? Hypercholesteremia   ? Hypertension   ? Kidney stones   ? Low back pain   ? MI (myocardial infarction) (HCC)   ? Rheumatoid arthritis (HCC)   ? Vitamin D deficiency   ? ? ?Medications:  ?Medications Prior to Admission  ?Medication Sig Dispense Refill Last Dose  ? clonazePAM (KLONOPIN) 1 MG tablet Take 1 mg by mouth 2 (two) times daily.   06/06/2021  ? lisinopril-hydrochlorothiazide (PRINZIDE,ZESTORETIC) 20-12.5 MG tablet Take 1 tablet by mouth daily. 30 tablet 2   ? ticagrelor (BRILINTA) 90 MG TABS tablet Take 1 tablet (90 mg total) by mouth 2 (two) times daily. (Patient not taking: Reported on 06/06/2021) 60 tablet 2 Not Taking  ? ?Scheduled:  ? Chlorhexidine Gluconate Cloth  6 each Topical Q0600  ? clonazePAM  1 mg Per Tube BID  ? docusate  100 mg Per Tube Daily  ? insulin aspart  0-15 Units  Subcutaneous Q4H  ? pantoprazole (PROTONIX) IV  40 mg Intravenous QHS  ? polyethylene glycol  17 g Per Tube Daily  ? potassium chloride  40 mEq Per Tube Once  ? ?Infusions:  ? sodium chloride 250 mL (06/07/21 0900)  ? cefTRIAXone (ROCEPHIN)  IV 1 g (06/07/21 1518)  ? heparin 1,100 Units/hr (06/07/21 1818)  ? ?PRN: acetaminophen, labetalol, ondansetron (ZOFRAN) IV, polyethylene glycol ?Anti-infectives (From admission, onward)  ? ? Start     Dose/Rate Route Frequency Ordered Stop  ? 06/06/21 1500  cefTRIAXone (ROCEPHIN) 1 g in sodium chloride 0.9 % 100 mL IVPB       ? 1 g ?200 mL/hr over 30 Minutes Intravenous Every 24 hours 06/06/21 1404    ? ?  ? ?Pt may not be taking ticagrelor,  as there are no dispenses per fill history.  ? ?Assessment: ?Pharmacy consulted to start heparin for elevated troponin. No DOAC PTA.  ? ? ?Goal of Therapy:  ?Heparin level 0.3-0.7 units/ml ?Monitor platelets by anticoagulation protocol: Yes ? ?4/18 0255 HL <0.10, subtherapeutic ?4/18 1039 HL   0.11, subtherapeutic ?4/18 1923 HL   0.16 , subtherapeutic.  ? ?Plan:  ?Heparin level is subtherapeutic. Will give 2000 units bolus and increase heparin infusion to 1300 units/hr. Recheck heparin level in 6 hours. CBC daily while on heparin.  ? ? ?Ronnald Ramp, PharmD ?06/07/2021 ?7:54 PM ?

## 2021-06-07 NOTE — Consult Note (Signed)
ANTICOAGULATION CONSULT NOTE  ? ?Pharmacy Consult for Heparin ?Indication: chest pain/ACS ? ?No Known Allergies ? ?Patient Measurements: ?Height: 4\' 11"  (149.9 cm) ?Weight: 62.3 kg (137 lb 5.6 oz) ?IBW/kg (Calculated) : 43.2 ?Heparin Dosing Weight: 60 kg ? ?Vital Signs: ?Temp: 100.2 ?F (37.9 ?C) (04/18 0737) ?Temp Source: Oral (04/18 0130) ?BP: 106/51 (04/18 0900) ?Pulse Rate: 83 (04/18 0900) ? ?Labs: ?Recent Labs  ?  06/06/21 ?0838 06/06/21 ?1500 06/06/21 ?2135 06/06/21 ?2353 06/07/21 ?0255 06/07/21 ?1039  ?HGB 14.3  --   --   --  11.8*  --   ?HCT 45.3  --   --   --  35.6*  --   ?PLT 222  --   --   --  191  --   ?APTT  --   --  27  --   --   --   ?LABPROT  --   --  13.1  --   --   --   ?INR  --   --  1.0  --   --   --   ?HEPARINUNFRC  --   --   --   --  <0.10* 0.11*  ?CREATININE 1.05*  --   --   --  0.83  --   ?TROPONINIHS  --    < > 934* 1,118* 930*  --   ? < > = values in this interval not displayed.  ? ? ? ?Estimated Creatinine Clearance: 60 mL/min (by C-G formula based on SCr of 0.83 mg/dL). ? ? ?Medical History: ?Past Medical History:  ?Diagnosis Date  ? Abnormal EKG   ? GERD (gastroesophageal reflux disease)   ? Hot flashes   ? Hypercholesteremia   ? Hypertension   ? Kidney stones   ? Low back pain   ? MI (myocardial infarction) ([place])   ? Rheumatoid arthritis ([place])   ? Vitamin D deficiency   ? ? ?Medications:  ?Medications Prior to Admission  ?Medication Sig Dispense Refill Last Dose  ? clonazePAM (KLONOPIN) 1 MG tablet Take 1 mg by mouth 2 (two) times daily.   06/06/2021  ? lisinopril-hydrochlorothiazide (PRINZIDE,ZESTORETIC) 20-12.5 MG tablet Take 1 tablet by mouth daily. 30 tablet 2   ? ticagrelor (BRILINTA) 90 MG TABS tablet Take 1 tablet (90 mg total) by mouth 2 (two) times daily. (Patient not taking: Reported on 06/06/2021) 60 tablet 2 Not Taking  ? ?Scheduled:  ? Chlorhexidine Gluconate Cloth  6 each Topical Q0600  ? clonazePAM  1 mg Per Tube BID  ? docusate  100 mg Per Tube Daily  ? feeding supplement  (VITAL HIGH PROTEIN)  1,000 mL Per Tube Q24H  ? insulin aspart  0-15 Units Subcutaneous Q4H  ? pantoprazole (PROTONIX) IV  40 mg Intravenous QHS  ? polyethylene glycol  17 g Per Tube Daily  ? potassium chloride  40 mEq Per Tube Once  ? ?Infusions:  ? sodium chloride 250 mL (06/06/21 1430)  ? cefTRIAXone (ROCEPHIN)  IV 1 g (06/06/21 1624)  ? dexmedetomidine (PRECEDEX) IV infusion 0.4 mcg/kg/hr (06/07/21 0748)  ? heparin 900 Units/hr (06/07/21 0600)  ? magnesium sulfate bolus IVPB    ? norepinephrine (LEVOPHED) Adult infusion Stopped (06/06/21 1130)  ? propofol (DIPRIVAN) infusion 40 mcg/kg/min (06/07/21 0600)  ? ?PRN: acetaminophen, fentaNYL (SUBLIMAZE) injection, labetalol, ondansetron (ZOFRAN) IV, polyethylene glycol ?Anti-infectives (From admission, onward)  ? ? Start     Dose/Rate Route Frequency Ordered Stop  ? 06/06/21 1500  cefTRIAXone (ROCEPHIN) 1 g in sodium chloride 0.9 %  100 mL IVPB       ? 1 g ?200 mL/hr over 30 Minutes Intravenous Every 24 hours 06/06/21 1404    ? ?  ? ?Pt may not be taking ticagrelor, as there are no dispenses per fill history.  ? ?Assessment: ?Pharmacy consulted to start heparin for elevated troponin. No DOAC PTA. Cards to see patient tomorrow.  ?  ?Baseline Hgb 14.3 > 11.8; Plts 222 > 191.  ?CBC stable. Received boluses and mIVF in past 24 hours, thus suspect possibly hemodilution. ? ?Goal of Therapy:  ?Heparin level 0.3-0.7 units/ml ?Monitor platelets by anticoagulation protocol: Yes ? ?4/18 0255 HL <0.10, subtherapeutic ?4/18 1039 HL   0.11, subtherapeutic ? ?Plan: Heparin level subtherapeutic ?Give 1800 units bolus x 1 ?Increase heparin infusion to 1100 units/hr ?Check anti-Xa level in 6 hours and daily while on heparin ?Continue to monitor H&H and platelets ?Follow cardiology plans  ? ? ?Wynelle Cleveland, PharmD ?Pharmacy Resident  ?06/07/2021 ?11:26 AM ?

## 2021-06-07 NOTE — Consult Note (Signed)
PHARMACY CONSULT NOTE - FOLLOW UP ? ?Pharmacy Consult for Electrolyte Monitoring and Replacement  ? ?Recent Labs: ?Potassium (mmol/L)  ?Date Value  ?06/07/2021 3.8  ?12/25/2013 4.1  ? ?Magnesium (mg/dL)  ?Date Value  ?06/07/2021 1.7  ? ?Calcium (mg/dL)  ?Date Value  ?06/07/2021 8.5 (L)  ? ?Calcium, Total (mg/dL)  ?Date Value  ?12/25/2013 9.5  ? ?Albumin (g/dL)  ?Date Value  ?06/06/2021 3.8  ?12/25/2013 3.8  ? ?Phosphorus (mg/dL)  ?Date Value  ?06/07/2021 4.0  ? ?Sodium (mmol/L)  ?Date Value  ?06/07/2021 138  ?12/25/2013 141  ? ? ? ?Assessment: ?56 year old female w/ PMH of  RA, CAD, HTN, HDL presented to ED 06/06/21 via EMS with concerns for overdose after patient found hypoxic and unresponsive. Patient emergently intubated in ED and admitted to ICU. Pharmacy has been consulted to monitor and replace electrolytes as needed. Patient with prolonged Qtc of 541. ? ?Infusions: LR @ 50 mL/hr. Renal function consistent with baseline.On sedation with propofol and fentanyl boluses. Nutrition supplements started 4/18. Still NPO. ? ?Goal of Therapy:  ?Electrolytes within normal limits ?Mag > 2 ?K 4-5 ? ?Plan:  ?Mag 1.7. Magnesium 2 grams x 1. ?Magnesium 2 gram bag unavailable. Will order magnesium 1 gram to be given this AM and magnesium 1 gram for this afternoon. ?KCl 40 mEq per tube x 1  ?Recheck electrolytes with AM labs  ? ?Jaynie Bream, PharmD ?Pharmacy Resident  ?06/07/2021 ?7:33 AM ? ?

## 2021-06-07 NOTE — Progress Notes (Signed)
Pt's son Nicole Ayala updated at bedside.  Discussed extubation this morning, psychiatry consulted for drug overdose, treating for UTI and ? Aspiration, and Cardiology consultation for elevated troponin's.  [place] to [place]. ? ?All questions answered to his satisfaction.  He is very appreciative of update. ? ? ? ?Nicole Ayala, AGACNP-BC ?[facility] ?Prefer epic messenger for cross cover needs ?If after hours, please call E-link ? ?

## 2021-06-08 ENCOUNTER — Encounter: Payer: Self-pay | Admitting: *Deleted

## 2021-06-08 DIAGNOSIS — R778 Other specified abnormalities of plasma proteins: Secondary | ICD-10-CM

## 2021-06-08 DIAGNOSIS — G9341 Metabolic encephalopathy: Secondary | ICD-10-CM | POA: Diagnosis not present

## 2021-06-08 DIAGNOSIS — I2510 Atherosclerotic heart disease of native coronary artery without angina pectoris: Secondary | ICD-10-CM | POA: Diagnosis not present

## 2021-06-08 DIAGNOSIS — T50901A Poisoning by unspecified drugs, medicaments and biological substances, accidental (unintentional), initial encounter: Secondary | ICD-10-CM

## 2021-06-08 DIAGNOSIS — R739 Hyperglycemia, unspecified: Secondary | ICD-10-CM | POA: Clinically undetermined

## 2021-06-08 DIAGNOSIS — A419 Sepsis, unspecified organism: Secondary | ICD-10-CM | POA: Diagnosis present

## 2021-06-08 DIAGNOSIS — I10 Essential (primary) hypertension: Secondary | ICD-10-CM | POA: Diagnosis present

## 2021-06-08 DIAGNOSIS — N390 Urinary tract infection, site not specified: Secondary | ICD-10-CM | POA: Diagnosis present

## 2021-06-08 DIAGNOSIS — T50904A Poisoning by unspecified drugs, medicaments and biological substances, undetermined, initial encounter: Secondary | ICD-10-CM

## 2021-06-08 LAB — CBC
HCT: 36.4 % (ref 36.0–46.0)
Hemoglobin: 11.8 g/dL — ABNORMAL LOW (ref 12.0–15.0)
MCH: 30.4 pg (ref 26.0–34.0)
MCHC: 32.4 g/dL (ref 30.0–36.0)
MCV: 93.8 fL (ref 80.0–100.0)
Platelets: 152 10*3/uL (ref 150–400)
RBC: 3.88 MIL/uL (ref 3.87–5.11)
RDW: 12.2 % (ref 11.5–15.5)
WBC: 14.5 10*3/uL — ABNORMAL HIGH (ref 4.0–10.5)
nRBC: 0 % (ref 0.0–0.2)

## 2021-06-08 LAB — GLUCOSE, CAPILLARY
Glucose-Capillary: 77 mg/dL (ref 70–99)
Glucose-Capillary: 78 mg/dL (ref 70–99)
Glucose-Capillary: 84 mg/dL (ref 70–99)
Glucose-Capillary: 87 mg/dL (ref 70–99)
Glucose-Capillary: 88 mg/dL (ref 70–99)

## 2021-06-08 LAB — HEPARIN LEVEL (UNFRACTIONATED)
Heparin Unfractionated: 0.28 IU/mL — ABNORMAL LOW (ref 0.30–0.70)
Heparin Unfractionated: 0.27 IU/mL — ABNORMAL LOW (ref 0.30–0.70)

## 2021-06-08 LAB — BASIC METABOLIC PANEL
Anion gap: 8 (ref 5–15)
BUN: 14 mg/dL (ref 6–20)
CO2: 25 mmol/L (ref 22–32)
Calcium: 8.7 mg/dL — ABNORMAL LOW (ref 8.9–10.3)
Chloride: 105 mmol/L (ref 98–111)
Creatinine, Ser: 0.61 mg/dL (ref 0.44–1.00)
GFR, Estimated: >60 mL/min (ref >60)
Glucose, Bld: 84 mg/dL (ref 70–99)
Potassium: 3.5 mmol/L (ref 3.5–5.1)
Sodium: 138 mmol/L (ref 135–145)

## 2021-06-08 LAB — PHOSPHORUS: Phosphorus: 2.6 mg/dL (ref 2.5–4.6)

## 2021-06-08 LAB — URINE CULTURE: Culture: >=100000 — AB

## 2021-06-08 LAB — PROCALCITONIN: Procalcitonin: 0.21 ng/mL

## 2021-06-08 LAB — MAGNESIUM: Magnesium: 2 mg/dL (ref 1.7–2.4)

## 2021-06-08 MED ORDER — LISINOPRIL-HYDROCHLOROTHIAZIDE 20-12.5 MG PO TABS: 1.0000 | ORAL_TABLET | Freq: Every day | ORAL | Status: DC

## 2021-06-08 MED ORDER — HYDROCHLOROTHIAZIDE 25 MG PO TABS
25.0000 mg | ORAL_TABLET | Freq: Every day | ORAL | Status: DC
  Administered 2021-06-09 – 2021-06-10 (×2): 25 mg via ORAL
  Filled 2021-06-08 (×2): qty 1

## 2021-06-08 MED ORDER — LISINOPRIL 20 MG PO TABS
20.0000 mg | ORAL_TABLET | Freq: Once | ORAL | Status: AC
  Administered 2021-06-08: 20 mg via ORAL
  Filled 2021-06-08: qty 1

## 2021-06-08 MED ORDER — ADULT MULTIVITAMIN W/MINERALS CH
1.0000 | ORAL_TABLET | Freq: Every day | ORAL | Status: DC
  Administered 2021-06-10: 1 via ORAL
  Filled 2021-06-08 (×2): qty 1

## 2021-06-08 MED ORDER — LISINOPRIL 20 MG PO TABS
40.0000 mg | ORAL_TABLET | Freq: Every day | ORAL | Status: DC
  Administered 2021-06-09 – 2021-06-10 (×2): 40 mg via ORAL
  Filled 2021-06-08 (×2): qty 2

## 2021-06-08 MED ORDER — CLONAZEPAM 1 MG PO TABS
1.0000 mg | ORAL_TABLET | Freq: Two times a day (BID) | ORAL | Status: DC
Start: 2021-06-08 — End: 2021-06-10
  Administered 2021-06-08 – 2021-06-10 (×5): 1 mg via ORAL
  Filled 2021-06-08 (×5): qty 1

## 2021-06-08 MED ORDER — TICAGRELOR 90 MG PO TABS
90.0000 mg | ORAL_TABLET | Freq: Two times a day (BID) | ORAL | Status: DC
  Administered 2021-06-08 – 2021-06-10 (×4): 90 mg via ORAL
  Filled 2021-06-08 (×4): qty 1

## 2021-06-08 MED ORDER — ENSURE ENLIVE PO LIQD
237.0000 mL | Freq: Two times a day (BID) | ORAL | Status: DC
  Administered 2021-06-10: 237 mL via ORAL

## 2021-06-08 MED ORDER — DOCUSATE SODIUM 50 MG/5ML PO LIQD
100.0000 mg | Freq: Every day | ORAL | Status: DC
  Filled 2021-06-08 (×2): qty 10

## 2021-06-08 MED ORDER — HEPARIN BOLUS VIA INFUSION
900.0000 [IU] | Freq: Once | INTRAVENOUS | Status: AC
  Administered 2021-06-08: 900 [IU] via INTRAVENOUS
  Filled 2021-06-08: qty 900

## 2021-06-08 MED ORDER — LISINOPRIL 20 MG PO TABS
20.0000 mg | ORAL_TABLET | Freq: Every day | ORAL | Status: DC
  Administered 2021-06-08: 20 mg via ORAL
  Filled 2021-06-08: qty 1

## 2021-06-08 MED ORDER — POTASSIUM CHLORIDE 10 MEQ/100ML IV SOLN
10.0000 meq | INTRAVENOUS | Status: DC
  Filled 2021-06-08 (×3): qty 100

## 2021-06-08 MED ORDER — POLYETHYLENE GLYCOL 3350 17 G PO PACK
17.0000 g | PACK | Freq: Every day | ORAL | Status: DC
  Filled 2021-06-08 (×2): qty 1

## 2021-06-08 MED ORDER — POTASSIUM CHLORIDE CRYS ER 20 MEQ PO TBCR
40.0000 meq | EXTENDED_RELEASE_TABLET | Freq: Once | ORAL | Status: AC
  Administered 2021-06-08: 40 meq via ORAL
  Filled 2021-06-08: qty 2

## 2021-06-08 MED ORDER — HEPARIN BOLUS VIA INFUSION
1000.0000 [IU] | Freq: Once | INTRAVENOUS | Status: AC
  Administered 2021-06-08: 1000 [IU] via INTRAVENOUS
  Filled 2021-06-08: qty 1000

## 2021-06-08 MED ORDER — HYDROCHLOROTHIAZIDE 25 MG PO TABS
12.5000 mg | ORAL_TABLET | Freq: Every day | ORAL | Status: DC
  Administered 2021-06-08: 12.5 mg via ORAL
  Filled 2021-06-08: qty 1

## 2021-06-08 MED ORDER — CEFAZOLIN SODIUM-DEXTROSE 1-4 GM/50ML-% IV SOLN
1.0000 g | Freq: Three times a day (TID) | INTRAVENOUS | Status: DC
  Administered 2021-06-08 (×2): 1 g via INTRAVENOUS
  Filled 2021-06-08 (×7): qty 50

## 2021-06-08 NOTE — Plan of Care (Signed)
ANOx3. Impulsive; bed alarm on. NSR 60s. BP 180s @0200  PRN labetalol givenx1. Pt having urinary urgency/incontinence. Poor appetite. R arm pain 6/10; PRN tylenol given.  ? ?~0400: SBP>200 despite labetalol dose given. HR mid 60s. Asymptomatic. NP Foust paged. Orders received for 5mg  hydralazine IV.  ? ?~0600: RAC IV painful and red. IV attemptsx2 failed. IV team at bedside: pt agitated wanting to go home and refusing care: see IV nurse note. ANOx3. Pt adamant to leave despite RN education about BP management. NP Foust notified. NP Foust at bedside to assess.  ? ?Problem: Education: ?Goal: Knowledge of General Education information will improve ?Description: Including pain rating scale, medication(s)/side effects and non-pharmacologic comfort measures ?Outcome: Progressing ?  ?Problem: Health Behavior/Discharge Planning: ?Goal: Ability to manage health-related needs will improve ?Outcome: Progressing ?  ?Problem: Clinical Measurements: ?Goal: Ability to maintain clinical measurements within normal limits will improve ?Outcome: Progressing ?Goal: Will remain free from infection ?Outcome: Progressing ?Goal: Diagnostic test results will improve ?Outcome: Progressing ?Goal: Respiratory complications will improve ?Outcome: Progressing ?Goal: Cardiovascular complication will be avoided ?Outcome: Progressing ?  ?Problem: Activity: ?Goal: Risk for activity intolerance will decrease ?Outcome: Progressing ?  ?Problem: Nutrition: ?Goal: Adequate nutrition will be maintained ?Outcome: Progressing ?  ?Problem: Coping: ?Goal: Level of anxiety will decrease ?Outcome: Progressing ?  ?Problem: Elimination: ?Goal: Will not experience complications related to bowel motility ?Outcome: Progressing ?Goal: Will not experience complications related to urinary retention ?Outcome: Progressing ?  ?Problem: Pain Managment: ?Goal: General experience of comfort will improve ?Outcome: Progressing ?  ?Problem: Safety: ?Goal: Ability to remain free  from injury will improve ?Outcome: Progressing ?  ?Problem: Skin Integrity: ?Goal: Risk for impaired skin integrity will decrease ?Outcome: Progressing ?  ?Problem: Activity: ?Goal: Ability to tolerate increased activity will improve ?Outcome: Progressing ?  ?Problem: Respiratory: ?Goal: Ability to maintain a clear airway and adequate ventilation will improve ?Outcome: Progressing ?  ?Problem: Role Relationship: ?Goal: Method of communication will improve ?Outcome: Progressing ?  ?
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
## 2021-06-08 NOTE — Assessment & Plan Note (Addendum)
Presented with fever, tachycardia, UTI. ?Sepsis physiology improved. ?See UTI for mgmt. ? ?
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
## 2021-06-08 NOTE — Consult Note (Signed)
ANTICOAGULATION CONSULT NOTE  ? ?Pharmacy Consult for Heparin ?Indication: chest pain/ACS ? ?No Known Allergies ? ?Patient Measurements: ?Height: 4\' 11"  (149.9 cm) ?Weight: 60.8 kg (134 lb 0.6 oz) ?IBW/kg (Calculated) : 43.2 ?Heparin Dosing Weight: 60 kg ? ?Vital Signs: ?Temp: 98.5 ?F (36.9 ?C) (04/19 0800) ?Temp Source: Axillary (04/19 0400) ?BP: 208/90 (04/19 0900) ?Pulse Rate: 69 (04/19 0900) ? ?Labs: ?Recent Labs  ?  06/06/21 ?0838 06/06/21 ?1500 06/06/21 ?2135 06/06/21 ?2353 06/07/21 ?0255 06/07/21 ?1039 06/07/21 ?1923 06/08/21 ?0312 06/08/21 ?1114  ?HGB 14.3  --   --   --  11.8*  --   --  11.8*  --   ?HCT 45.3  --   --   --  35.6*  --   --  36.4  --   ?PLT 222  --   --   --  191  --   --  152  --   ?APTT  --   --  27  --   --   --   --   --   --   ?LABPROT  --   --  13.1  --   --   --   --   --   --   ?INR  --   --  1.0  --   --   --   --   --   --   ?HEPARINUNFRC  --   --   --   --  <0.10*   < > 0.16* 0.28* 0.27*  ?CREATININE 1.05*  --   --   --  0.83  --   --  0.61  --   ?TROPONINIHS  --    < > 934* 1,118* 930*  --   --   --   --   ? < > = values in this interval not displayed.  ? ? ? ?Estimated Creatinine Clearance: 61.5 mL/min (by C-G formula based on SCr of 0.61 mg/dL). ? ? ?Medical History: ?Past Medical History:  ?Diagnosis Date  ? Abnormal EKG   ? GERD (gastroesophageal reflux disease)   ? Hot flashes   ? Hypercholesteremia   ? Hypertension   ? Kidney stones   ? Low back pain   ? MI (myocardial infarction) (HCC)   ? Rheumatoid arthritis (HCC)   ? Vitamin D deficiency   ? ? ?Medications:  ?Medications Prior to Admission  ?Medication Sig Dispense Refill Last Dose  ? clonazePAM (KLONOPIN) 1 MG tablet Take 1 mg by mouth 2 (two) times daily.   06/06/2021  ? lisinopril-hydrochlorothiazide (PRINZIDE,ZESTORETIC) 20-12.5 MG tablet Take 1 tablet by mouth daily. 30 tablet 2   ? ticagrelor (BRILINTA) 90 MG TABS tablet Take 1 tablet (90 mg total) by mouth 2 (two) times daily. (Patient not taking: Reported on  06/06/2021) 60 tablet 2 Not Taking  ? ?Scheduled:  ? Chlorhexidine Gluconate Cloth  6 each Topical Q0600  ? clonazePAM  1 mg Oral BID  ? docusate  100 mg Oral Daily  ? feeding supplement  237 mL Oral BID BM  ? lisinopril  20 mg Oral Daily  ? And  ? hydrochlorothiazide  12.5 mg Oral Daily  ? insulin aspart  0-15 Units Subcutaneous Q4H  ? [START ON 06/09/2021] multivitamin with minerals  1 tablet Oral Daily  ? polyethylene glycol  17 g Oral Daily  ? ?Infusions:  ? sodium chloride 10 mL/hr at 06/08/21 0600  ?  ceFAZolin (ANCEF) IV    ? heparin 1,400 Units/hr (  06/08/21 0600)  ? ?PRN: acetaminophen, labetalol, ondansetron (ZOFRAN) IV, polyethylene glycol ?Anti-infectives (From admission, onward)  ? ? Start     Dose/Rate Route Frequency Ordered Stop  ? 06/08/21 1400  ceFAZolin (ANCEF) IVPB 1 g/50 mL premix       ? 1 g ?100 mL/hr over 30 Minutes Intravenous Every 8 hours 06/08/21 1116    ? 06/06/21 1500  cefTRIAXone (ROCEPHIN) 1 g in sodium chloride 0.9 % 100 mL IVPB  Status:  Discontinued       ? 1 g ?200 mL/hr over 30 Minutes Intravenous Every 24 hours 06/06/21 1404 06/08/21 1116  ? ?  ? ?Pt may not be taking ticagrelor, as there are no dispenses per fill history.  ? ?Assessment: ?Pharmacy consulted to start heparin for elevated troponin. No DOAC PTA.  ? ?Goal of Therapy:  ?Heparin level 0.3-0.7 units/ml ?Monitor platelets by anticoagulation protocol: Yes ? ?4/18 0255 HL <0.10, subtherapeutic ?4/18 1039 HL   0.11, subtherapeutic ?4/18 1923 HL   0.16, subtherapeutic ?4/19 0312 HL  0.28, subtherapeutic ?4/19 1114 HL  0.27, subtherapeutic ? ?Plan: Heparin level is subtherapeutic. ?Will give 1,000 units bolus ?Heparin infusion to 1500 units/hr. ?Recheck heparin level in 6 hours after rate change. ?CBC daily while on heparin.  ? ?Jaynie Bream, PharmD ?Pharmacy Resident  ?06/08/2021 ?11:42 AM ? ? ?

## 2021-06-08 NOTE — Assessment & Plan Note (Addendum)
Stable, no active chest pain. ?Elevated troponin felt due to demand ischemia in acute setting.  Completed 48 hrs heparin empirically.   ? ?Followed by Dr. Juliann Pares, last f/u appears was October 2019. ? ?Hx of STEMI in 2019 - left heart cath at that time showed three vessel CAD, severe involving proximal and mid LAD, moderate disease of LCx and RCA.   ?Stent x2 (overlapping) placed to prox/mid LAD. ?Was recommended for 12 months DAPT with ASA and Brilinta. ? ?--Continue Coreg ?--Cardiology consulted, f/u pending recs ?-- Resumed on Brilinta  ?

## 2021-06-08 NOTE — Progress Notes (Signed)
Nutrition Follow-up ? ?DOCUMENTATION CODES:  ? ?Not applicable ? ?INTERVENTION:  ? ?Ensure Enlive po BID, each supplement provides 350 kcal and 20 grams of protein. ? ?MVI po daily  ? ?NUTRITION DIAGNOSIS:  ? ?Inadequate oral intake related to inability to eat as evidenced by NPO status. ? ?GOAL:  ? ?Patient will meet greater than or equal to 90% of their needs ?-not met  ? ?MONITOR:  ? ?PO intake, Supplement acceptance, Labs, Weight trends, Skin, I & O's ? ?ASSESSMENT:  ? ?56 year old female admitted with acute metabolic encephalopathy and acute hypercapnic respiratory failure in the setting of suspected drug overdose (UDS + for amphetamines) requiring intubation for airway protection.  Also found to have UTI. ? ?Pt initiated on a regular diet today. RD will add supplements and MVI to help pt meet her estimated needs. Pt is likely at low refeed risk. Per chart, pt is weight stable since admission.   ? ?Medications reviewed and include: colace, insulin, protonix, miralax, ceftriaxone, heparin  ? ?Labs reviewed: K 3.5 wnl, P 2.6 wnl, Mg 2.0 wnl ?Wbc- 14.5(H) ? ?Diet Order:   ?Diet Order   ? ?       ?  Diet regular Room service appropriate? Yes; Fluid consistency: Thin  Diet effective now       ?  ? ?  ?  ? ?  ? ?EDUCATION NEEDS:  ? ?Not appropriate for education at this time ? ?Skin:  Skin Assessment: Reviewed RN Assessment ? ?Last BM:  Unknown ? ?Height:  ? ?Ht Readings from Last 1 Encounters:  ?06/06/21 4' 11" (1.499 m)  ? ? ?Weight:  ? ?Wt Readings from Last 1 Encounters:  ?06/08/21 60.8 kg  ? ? ?Ideal Body Weight:  44.7 kg ? ?BMI:  Body mass index is 27.07 kg/m?. ? ?Estimated Nutritional Needs:  ? ?Kcal:  1400-1600kcal/day ? ?Protein:  70-80g/day ? ?Fluid:  1.4-1.6L/day ? ?Nicole Distance MS, RD, LDN ?Please refer to [facility] for RD and/or RD on-call/weekend/after hours pager ? ?

## 2021-06-08 NOTE — Consult Note (Signed)
?CARDIOLOGY CONSULT NOTE  ? ?   ?    ?  ? ? ? ?Patient ID: ?Nicole Ayala ?MRN: [id number] ?DOB/AGE: 22-Oct-1965 56 y.o. ? ?Admit date: 06/06/2021 ?Referring Physician Dr. Belia Heman ?Primary Physician  ?Primary Cardiologist Olivier Frayre ?Reason for Consultation respiratory failure ? ?HPI: Patient presented with respiratory failure acute metabolic encephalopathy hypercapnic respiratory failure overall overdose patient has history of coronary artery disease.  Presented with failure.  Patient reportedly suffered unintentional overdose and needed intubation for airway protection.  Patient reportedly taken a bottle of clonazepam with 12 paraphernalia and was intubated cardiology was then consulted for further management for known coronary disease reportedly had some episodes of arrhythmia ? ?Review of systems complete and found to be negative unless listed above  ? ? ? ?Past Medical History:  ?Diagnosis Date  ? Abnormal EKG   ? GERD (gastroesophageal reflux disease)   ? Hot flashes   ? Hypercholesteremia   ? Hypertension   ? Kidney stones   ? Low back pain   ? MI (myocardial infarction) (HCC)   ? Rheumatoid arthritis (HCC)   ? Vitamin D deficiency   ?  ?Past Surgical History:  ?Procedure Laterality Date  ? CARDIAC SURGERY    ? CORONARY/GRAFT ACUTE MI REVASCULARIZATION N/A 11/17/2017  ? Procedure: Coronary/Graft Acute MI Revascularization;  Surgeon: Yvonne Kendall, MD;  Location: [facility] INVASIVE CV [facility];  Service: Cardiovascular;  Laterality: N/A;  ? LEFT HEART CATH AND CORONARY ANGIOGRAPHY N/A 11/17/2017  ? Procedure: LEFT HEART CATH AND CORONARY ANGIOGRAPHY;  Surgeon: Yvonne Kendall, MD;  Location: [facility] INVASIVE CV LAB;  Service: Cardiovascular;  Laterality: N/A;  ? TONSILLECTOMY    ? TUBAL LIGATION    ?  ?Medications Prior to Admission  ?Medication Sig Dispense Refill Last Dose  ? clonazePAM (KLONOPIN) 1 MG tablet Take 1 mg by mouth 2 (two) times daily.   06/06/2021  ? lisinopril-hydrochlorothiazide (PRINZIDE,ZESTORETIC) 20-12.5 MG  tablet Take 1 tablet by mouth daily. 30 tablet 2   ? ticagrelor (BRILINTA) 90 MG TABS tablet Take 1 tablet (90 mg total) by mouth 2 (two) times daily. (Patient not taking: Reported on 06/06/2021) 60 tablet 2 Not Taking  ? ?Social History  ? ?Socioeconomic History  ? Marital status: Legally Separated  ?  Spouse name: Not on file  ? Number of children: Not on file  ? Years of education: Not on file  ? Highest education level: Not on file  ?Occupational History  ? Not on file  ?Tobacco Use  ? Smoking status: Every Day  ?  Packs/day: 1.00  ?  Types: Cigarettes  ? Smokeless tobacco: Never  ?Vaping Use  ? Vaping Use: Never used  ?Substance and Sexual Activity  ? Alcohol use: No  ? 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 Sexual activity: Not on file  ?Other Topics Concern  ? Not on file  ?Social History Narrative  ? Not on file  ? ?Social Determinants of Health  ? ?Financial Resource Strain: Not on file  ?Food Insecurity: Not on file  ?Transportation Needs: Not on file  ?Physical Activity: Not on file  ?Stress: Not on file  ?Social Connections: Not on file  ?Intimate Partner Violence: Not on file  ?  ?Family History  ?Problem Relation Age of Onset  ? Heart failure Mother   ? Cancer Father   ? Hematuria Neg Hx   ? Kidney disease Neg Hx   ? Bladder Cancer Neg Hx   ?  ? ? ?  Review of systems complete and found to be negative unless listed above  ? ? ? ? ?PHYSICAL EXAM ? ?General: Well developed, well nourished, in no acute distress intubated sedated ?HEENT:  Normocephalic and atramatic ?Neck:  No JVD.  ?Lungs: Clear bilaterally to auscultation and percussion. ?Heart: HRRR . Normal S1 and S2 without gallops or murmurs.  ?Abdomen: Bowel sounds are positive, abdomen soft and non-tender  ?Msk:  Back normal, normal gait. Normal strength and tone for age. ?Extremities: No clubbing, cyanosis or edema.   ?Neuro: Intubated sedated ?Psych: Intubated sedated ? ?Labs: ?  ?Lab Results  ?Component Value Date  ? WBC  14.5 (H) 06/08/2021  ? HGB 11.8 (L) 06/08/2021  ? HCT 36.4 06/08/2021  ? MCV 93.8 06/08/2021  ? PLT 152 06/08/2021  ?  ?Recent Labs  ?Lab 06/06/21 ?1610 06/06/21 ?1237 06/08/21 ?9604  ?NA 135   < > 138  ?K 4.7   < > 3.5  ?CL 99   < > 105  ?CO2 25   < > 25  ?BUN 19   < > 14  ?CREATININE 1.05*   < > 0.61  ?CALCIUM 8.9   < > 8.7*  ?PROT 7.5  --   --   ?BILITOT 0.8  --   --   ?ALKPHOS 113  --   --   ?ALT 18  --   --   ?AST 30  --   --   ?GLUCOSE 398*   < > 84  ? < > = values in this interval not displayed.  ? ?Lab Results  ?Component Value Date  ? CKTOTAL 58 06/26/2013  ? CKMB 0.5 06/26/2013  ? TROPONINI 6.20 (HH) 11/18/2017  ?  ?Lab Results  ?Component Value Date  ? CHOL 158 11/18/2017  ? ?Lab Results  ?Component Value Date  ? HDL 46 11/18/2017  ? ?Lab Results  ?Component Value Date  ? LDLCALC 89 11/18/2017  ? ?Lab Results  ?Component Value Date  ? TRIG 54 06/07/2021  ? TRIG 115 11/18/2017  ? ?Lab Results  ?Component Value Date  ? CHOLHDL 3.4 11/18/2017  ? ?No results found for: LDLDIRECT  ?  ?Radiology: DG Abdomen 1 View ? ?Result Date: 06/06/2021 ?CLINICAL DATA:  56 year old female intubated. Enteric tube placement. EXAM: ABDOMEN - 1 VIEW COMPARISON:  CT Abdomen and Pelvis 12/25/2013. FINDINGS: Portable AP semi upright view at 1041 hours. Enteric tube terminates in the stomach. Side hole is at the level of the GEJ. Non obstructed bowel gas pattern. Chronic left nephrolithiasis. Negative visible lung bases. No acute osseous abnormality identified. IMPRESSION: Enteric tube terminates in the stomach, side hole at the level of the GEJ. Advance 5 cm to ensure side hole placement in the stomach. Electronically Signed   By: Odessa Fleming M.D.   On: 06/06/2021 10:56  ? ?CT HEAD WO CONTRAST ( ) ? ?Result Date: 06/06/2021 ?CLINICAL DATA:  Mental status change, unknown cause. EXAM: CT HEAD WITHOUT CONTRAST TECHNIQUE: Contiguous axial images were obtained from the base of the skull through the vertex without intravenous contrast.  RADIATION DOSE REDUCTION: This exam was performed according to the departmental dose-optimization program which includes automated exposure control, adjustment of the mA and/or kV according to patient size and/or use of iterative reconstruction technique. COMPARISON:  09/16/2018 FINDINGS: Brain: No mass lesion, hemorrhage, hydrocephalus, acute infarct, intra-axial, or extra-axial fluid collection. Vascular: No hyperdense vessel or unexpected calcification. Skull: Normal Sinuses/Orbits: Normal imaged portions of the orbits and globes. Bilateral mastoid fluid. Prominent nasopharyngeal  soft tissues including on 01/02, similar to on the prior. Other: None. IMPRESSION: No acute intracranial abnormality. Bilateral mastoid effusions. Soft tissue fullness for age within the nasopharynx is similar to 09/16/2018 and therefore likely within normal variation. This could be correlated with physical exam. Electronically Signed   By: Jeronimo GreavesKyle  Talbot M.D.   On: 06/06/2021 09:14  ? ?DG Chest Port 1 View ? ?Result Date: 06/07/2021 ?CLINICAL DATA:  Acute respiratory failure with hypoxia and hypercarbia. EXAM: PORTABLE CHEST 1 VIEW COMPARISON:  Chest radiograph 06/06/2021 FINDINGS: Endotracheal tube terminates approximately 8 mm above the carina, unchanged. Enteric tube courses into the abdomen with tip not imaged. The cardiomediastinal silhouette is within normal limits for portable AP technique and mild rightward patient rotation. There is slight elevation of the left hemidiaphragm with unchanged mild left basilar opacity. There is mild central pulmonary vascular congestion without overt edema. No sizable pleural effusion or pneumothorax is identified. IMPRESSION: Support devices as above.  Left basilar atelectasis. Electronically Signed   By: Sebastian AcheAllen  Grady M.D.   On: 06/07/2021 08:08  ? ?DG Chest Port 1 View ? ?Result Date: 06/06/2021 ?CLINICAL DATA:  Status post intubation with OG tube placement. EXAM: PORTABLE CHEST 1 VIEW COMPARISON:   None. FINDINGS: ET tube is identified with tip just above the carina (by approximately 8 mm). Enteric tube tip and side port are below the level of the GE junction in the expected location of the stomach.

## 2021-06-08 NOTE — Consult Note (Addendum)
PHARMACY CONSULT NOTE - FOLLOW UP ? ?Pharmacy Consult for Electrolyte Monitoring and Replacement  ? ?Recent Labs: ?Potassium (mmol/L)  ?Date Value  ?06/08/2021 3.5  ?12/25/2013 4.1  ? ?Magnesium (mg/dL)  ?Date Value  ?06/08/2021 2.0  ? ?Calcium (mg/dL)  ?Date Value  ?06/08/2021 8.7 (L)  ? ?Calcium, Total (mg/dL)  ?Date Value  ?12/25/2013 9.5  ? ?Albumin (g/dL)  ?Date Value  ?06/06/2021 3.8  ?12/25/2013 3.8  ? ?Phosphorus (mg/dL)  ?Date Value  ?06/08/2021 2.6  ? ?Sodium (mmol/L)  ?Date Value  ?06/08/2021 138  ?12/25/2013 141  ? ? ? ?Assessment: ?56 year old female w/ PMH of  RA, CAD, HTN, HDL presented to ED 06/06/21 via EMS with concerns for overdose after patient found hypoxic and unresponsive. Patient emergently intubated in ED and admitted to ICU. Pharmacy has been consulted to monitor and replace electrolytes as needed. Patient with prolonged Qtc of 541. ? ?Renal function consistent with baseline. Passed swallow evaluation. Diet thin liquids. ? ?Goal of Therapy:  ?Electrolytes within normal limits ?Mag > 2 ?K 4-5 ? ?Plan:  ?Kcl PO 40 mEq x 1 ?Recheck electrolytes with AM labs  ? ?Wynelle Cleveland, PharmD ?Pharmacy Resident  ?06/08/2021 ?8:30 AM ? ?

## 2021-06-08 NOTE — Assessment & Plan Note (Addendum)
Home regimen lisinopril 20 / HCTZ 12.5 mg.  BP uncontrolled. ?--Increased lisinopril to 40 mg daily ?--Increased HCTZ to 25 mg daily ?-- Added 10 mg amlodipine daily ?4/21: BP's now well controlled. ?--Close follow up with cardiology and/or PCP. ?

## 2021-06-08 NOTE — Consult Note (Signed)
ANTICOAGULATION CONSULT NOTE  ? ?Pharmacy Consult for Heparin ?Indication: chest pain/ACS ? ?No Known Allergies ? ?Patient Measurements: ?Height: 4\' 11"  (149.9 cm) ?Weight: 62.3 kg (137 lb 5.6 oz) ?IBW/kg (Calculated) : 43.2 ?Heparin Dosing Weight: 60 kg ? ?Vital Signs: ?Temp: 98.4 ?F (36.9 ?C) (04/19 0000) ?Temp Source: Axillary (04/19 0000) ?BP: 162/77 (04/19 0000) ?Pulse Rate: 77 (04/19 0000) ? ?Labs: ?Recent Labs  ?  06/06/21 ?0838 06/06/21 ?0838 06/06/21 ?1500 06/06/21 ?2135 06/06/21 ?2353 06/07/21 ?0255 06/07/21 ?1039 06/07/21 ?1923 06/08/21 ?[id number]  ?HGB 14.3  --   --   --   --  11.8*  --   --  11.8*  ?HCT 45.3  --   --   --   --  35.6*  --   --  36.4  ?PLT 222  --   --   --   --  191  --   --  152  ?APTT  --   --   --  27  --   --   --   --   --   ?LABPROT  --   --   --  13.1  --   --   --   --   --   ?INR  --   --   --  1.0  --   --   --   --   --   ?HEPARINUNFRC  --    < >  --   --   --  <0.10* 0.11* 0.16* 0.28*  ?CREATININE 1.05*  --   --   --   --  0.83  --   --  0.61  ?TROPONINIHS  --   --    < > 934* 1,118* 930*  --   --   --   ? < > = values in this interval not displayed.  ? ? ? ?Estimated Creatinine Clearance: 62.2 mL/min (by C-G formula based on SCr of 0.61 mg/dL). ? ? ?Medical History: ?Past Medical History:  ?Diagnosis Date  ? Abnormal EKG   ? GERD (gastroesophageal reflux disease)   ? Hot flashes   ? Hypercholesteremia   ? Hypertension   ? Kidney stones   ? Low back pain   ? MI (myocardial infarction) ([place])   ? Rheumatoid arthritis ([place])   ? Vitamin D deficiency   ? ? ?Medications:  ?Medications Prior to Admission  ?Medication Sig Dispense Refill Last Dose  ? clonazePAM (KLONOPIN) 1 MG tablet Take 1 mg by mouth 2 (two) times daily.   06/06/2021  ? lisinopril-hydrochlorothiazide (PRINZIDE,ZESTORETIC) 20-12.5 MG tablet Take 1 tablet by mouth daily. 30 tablet 2   ? ticagrelor (BRILINTA) 90 MG TABS tablet Take 1 tablet (90 mg total) by mouth 2 (two) times daily. (Patient not taking: Reported on  06/06/2021) 60 tablet 2 Not Taking  ? ?Scheduled:  ? Chlorhexidine Gluconate Cloth  6 each Topical Q0600  ? clonazePAM  1 mg Per Tube BID  ? docusate  100 mg Per Tube Daily  ? insulin aspart  0-15 Units Subcutaneous Q4H  ? pantoprazole (PROTONIX) IV  40 mg Intravenous QHS  ? polyethylene glycol  17 g Per Tube Daily  ? potassium chloride  40 mEq Per Tube Once  ? ?Infusions:  ? sodium chloride 10 mL/hr at 06/08/21 0000  ? cefTRIAXone (ROCEPHIN)  IV Stopped (06/07/21 1548)  ? heparin 1,300 Units/hr (06/08/21 0000)  ? ?PRN: acetaminophen, labetalol, ondansetron (ZOFRAN) IV, polyethylene glycol ?Anti-infectives (From admission, onward)  ? ?  Start     Dose/Rate Route Frequency Ordered Stop  ? 06/06/21 1500  cefTRIAXone (ROCEPHIN) 1 g in sodium chloride 0.9 % 100 mL IVPB       ? 1 g ?200 mL/hr over 30 Minutes Intravenous Every 24 hours 06/06/21 1404    ? ?  ? ?Pt may not be taking ticagrelor, as there are no dispenses per fill history.  ? ?Assessment: ?Pharmacy consulted to start heparin for elevated troponin. No DOAC PTA.  ? ? ?Goal of Therapy:  ?Heparin level 0.3-0.7 units/ml ?Monitor platelets by anticoagulation protocol: Yes ? ?4/18 0255 HL <0.10, subtherapeutic ?4/18 1039 HL   0.11, subtherapeutic ?4/18 1923 HL   0.16 , subtherapeutic.  ?4/19 0312 HL  0.28, subtherapeutic ? ?Plan:  ?Heparin level is subtherapeutic. Will give 900 units bolus and increase heparin infusion to 1400 units/hr. Recheck heparin level in 6 hours after rate change. CBC daily while on heparin.  ? ?Renda Rolls, PharmD, MBA ?06/08/2021 ?3:59 AM ? ?

## 2021-06-08 NOTE — Assessment & Plan Note (Addendum)
POA, resolved.  ?Due to apparent overdose and CO2 narcosis with respiratory depression.   ?Mental status improved and at baseline. ? ?Psychiatry consulted with pt confused, unsteady and attempting to leave AMA. ?Placed under IVC 4/20 after falling, deemed danger to self, unsafe to ambulate.  IVC rescinded on 4/21. ?

## 2021-06-08 NOTE — Assessment & Plan Note (Addendum)
Troponin peaked at 1118 on 4/18.   ?EKG no acute ischemic changes.   ?Empirically treated with heparin in ICU.   ?Cardiology consulted, recommendations still pending at time of d/c today. ?No active chest pain throughout admission. ?Echo with EF 65-70%, no LV regional WMA's, grade 1 diastolic dysfunction. ?
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
## 2021-06-08 NOTE — Assessment & Plan Note (Addendum)
A1c is 5.4%. ?Covered with sliding scale Novolog. ?PCP follow up. ?

## 2021-06-08 NOTE — Assessment & Plan Note (Addendum)
Started on empiric Rocephin pending urine culture.  Culture growing Klebsiella pneumoniae. ?Changed antibiotic to cefazolin. ?Completed antibiotics. ?

## 2021-06-08 NOTE — Hospital Course (Addendum)
56 year old female with Pmhx of CAD s/p STEMI in 2019 with PCI to LAD, hypertension admitted to ICU on 06/06/2021 with acute metabolic encephalopathy and acute hypercapnic respiratory failure in the setting of suspected drug overdose (UDS + for amphetamines) requiring intubation for airway protection.   Extubated on 06/07/2021 once encephalopathy improved. ? ?Also found to have UTI, started on empiric Rocephin pending cultures. ? ?Due to elevated hs-troponin, started on heparin drip in ICU, completed 48 hours of empiric ACS treatment.  EKG without acute ischemic changes.  Patient now awake and alert, denying recent or active chest pain.  Echo showed EF 65-70% without LV regional WMA's, grade 1 diastolic dysfunction.   ? ?Cardiology consulted by ICU team. ?[facility] assumed care on 06/08/2021. ? ?4/20: Pt attempted to leave AMA despite BP's still severely elevated.  Placed under IVC after psych evaluation, determined pt currently lacks capacity and danger to self.  Unsafe with ambulation, had a witnessed fall.   ?

## 2021-06-09 ENCOUNTER — Other Ambulatory Visit (HOSPITAL_COMMUNITY): Payer: Self-pay

## 2021-06-09 DIAGNOSIS — I160 Hypertensive urgency: Secondary | ICD-10-CM

## 2021-06-09 DIAGNOSIS — F4325 Adjustment disorder with mixed disturbance of emotions and conduct: Secondary | ICD-10-CM | POA: Diagnosis not present

## 2021-06-09 DIAGNOSIS — I10 Essential (primary) hypertension: Secondary | ICD-10-CM

## 2021-06-09 DIAGNOSIS — G9341 Metabolic encephalopathy: Secondary | ICD-10-CM | POA: Diagnosis not present

## 2021-06-09 DIAGNOSIS — T50904A Poisoning by unspecified drugs, medicaments and biological substances, undetermined, initial encounter: Secondary | ICD-10-CM | POA: Diagnosis not present

## 2021-06-09 LAB — CBC
HCT: 39.5 % (ref 36.0–46.0)
Hemoglobin: 13.4 g/dL (ref 12.0–15.0)
MCH: 30.8 pg (ref 26.0–34.0)
MCHC: 33.9 g/dL (ref 30.0–36.0)
MCV: 90.8 fL (ref 80.0–100.0)
Platelets: 222 10*3/uL (ref 150–400)
RBC: 4.35 MIL/uL (ref 3.87–5.11)
RDW: 11.9 % (ref 11.5–15.5)
WBC: 10.2 10*3/uL (ref 4.0–10.5)
nRBC: 0 % (ref 0.0–0.2)

## 2021-06-09 LAB — GLUCOSE, CAPILLARY
Glucose-Capillary: 102 mg/dL — ABNORMAL HIGH (ref 70–99)
Glucose-Capillary: 104 mg/dL — ABNORMAL HIGH (ref 70–99)
Glucose-Capillary: 108 mg/dL — ABNORMAL HIGH (ref 70–99)
Glucose-Capillary: 123 mg/dL — ABNORMAL HIGH (ref 70–99)
Glucose-Capillary: 132 mg/dL — ABNORMAL HIGH (ref 70–99)
Glucose-Capillary: 61 mg/dL — ABNORMAL LOW (ref 70–99)
Glucose-Capillary: 81 mg/dL (ref 70–99)
Glucose-Capillary: 85 mg/dL (ref 70–99)

## 2021-06-09 LAB — BASIC METABOLIC PANEL
Anion gap: 7 (ref 5–15)
BUN: 9 mg/dL (ref 6–20)
CO2: 24 mmol/L (ref 22–32)
Calcium: 9.1 mg/dL (ref 8.9–10.3)
Chloride: 107 mmol/L (ref 98–111)
Creatinine, Ser: 0.51 mg/dL (ref 0.44–1.00)
GFR, Estimated: >60 mL/min (ref >60)
Glucose, Bld: 87 mg/dL (ref 70–99)
Potassium: 3.5 mmol/L (ref 3.5–5.1)
Sodium: 138 mmol/L (ref 135–145)

## 2021-06-09 LAB — TROPONIN I (HIGH SENSITIVITY): Troponin I (High Sensitivity): 168 ng/L (ref <18)

## 2021-06-09 LAB — MAGNESIUM: Magnesium: 1.8 mg/dL (ref 1.7–2.4)

## 2021-06-09 LAB — PHOSPHORUS: Phosphorus: 2.4 mg/dL — ABNORMAL LOW (ref 2.5–4.6)

## 2021-06-09 LAB — PROCALCITONIN: Procalcitonin: 0.14 ng/mL

## 2021-06-09 MED ORDER — ENOXAPARIN SODIUM 40 MG/0.4ML IJ SOSY
40.0000 mg | PREFILLED_SYRINGE | INTRAMUSCULAR | Status: DC
  Filled 2021-06-09: qty 0.4

## 2021-06-09 MED ORDER — AMLODIPINE BESYLATE 10 MG PO TABS
10.0000 mg | ORAL_TABLET | Freq: Every day | ORAL | Status: DC
  Administered 2021-06-09 – 2021-06-10 (×2): 10 mg via ORAL
  Filled 2021-06-09 (×2): qty 1

## 2021-06-09 MED ORDER — DEXTROSE 50 % IV SOLN
INTRAVENOUS | Status: AC
  Filled 2021-06-09: qty 50

## 2021-06-09 MED ORDER — DEXTROSE 50 % IV SOLN
12.5000 g | INTRAVENOUS | Status: AC
  Administered 2021-06-09: 12.5 g via INTRAVENOUS

## 2021-06-09 MED ORDER — K PHOS MONO-SOD PHOS DI & MONO 155-852-130 MG PO TABS
500.0000 mg | ORAL_TABLET | ORAL | Status: AC
  Filled 2021-06-09 (×2): qty 2

## 2021-06-09 MED ORDER — HYDRALAZINE HCL 20 MG/ML IJ SOLN
5.0000 mg | Freq: Once | INTRAMUSCULAR | Status: AC
  Administered 2021-06-09: 5 mg via INTRAVENOUS
  Filled 2021-06-09: qty 1

## 2021-06-09 MED ORDER — MAGNESIUM SULFATE 2 GM/50ML IV SOLN
2.0000 g | Freq: Once | INTRAVENOUS | Status: DC
  Filled 2021-06-09: qty 50

## 2021-06-09 NOTE — Assessment & Plan Note (Addendum)
BPs have been uncontrolled despite home regimen resumed and dose is increased.   ?BP improved with adjustments to antihypertensives as outlined. ?See essential hypertension for plan.  ? ?

## 2021-06-09 NOTE — Progress Notes (Signed)
This IV [facility] Nurse was at bedside about to start PIV as consulted, pt. Was screaming at this Nurse at stated " no more sticking! I wanted to go home and I don't care!" RN called to bedside to notify pt.'s refusal. Pt. Was on the phone  with son and told him she is leaving.pt. was oriented x1. ?
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
## 2021-06-09 NOTE — Progress Notes (Signed)
PT Cancellation Note ? ?Patient Details ?Name: Nicole Ayala ?MRN: [id number] ?DOB: 02/14/1966 ? ? ?Cancelled Treatment:    Reason Eval/Treat Not Completed: Other (comment).  PT consult received.  Chart reviewed.  Nurse reports pt signing out AMA and will be leaving soon.  Pt laying in bed upon PT arrival.  Attempted to encourage pt to get OOB and walk (to make sure pt was safe walking) but pt continuously refusing and stating "I'll be ok"--nurse notified. ? ?Hendricks LimesEmily Lulubelle Simcoe, PT ?06/09/21, 12:22 PM ? ?

## 2021-06-09 NOTE — Progress Notes (Signed)
Patient is refusing IV insertion stating "it hurts and she is afraid" educated on need for IV antibiotics and still refused IV insertion. Also refused subcu lovenox. [facility] notified of refusal. Per shift report IV was removed as patient was going to leave AMA, but was then IVC by psych MD, patient has since refused to have new IV inserted.  ?

## 2021-06-09 NOTE — Progress Notes (Addendum)
? ?      CROSS COVER NOTE ? ?NAME: Nicole Ayala ?MRN: [id number] ?DOB : 28-Feb-1965 ? ?Secure chat received from nursing communicating that patient wants to leave the hospital immediately. ? ? ?On presentation to bedside [name] is sleeping, she is arousable to voice. She is able to state her name, date of birth, location, and month/year correctly. She is confused about the events of the last few days and does not believe she has been in the hospital for 3 days. She wants to go home because there is no one to care for her dogs. I discussed with [name] why she came into the hospital, her hospital course, and requested she stay at least until she is rounded on today. She is currently amenable to remaining inpatient. Care ordered placed to allow for administration of 10A Klonopin now.  ? ? ?Bishop Limbo MHA, MSN, FNP-BC ?Nurse Practitioner ?[facility] ?[place] ?Pager [phone] ? ?

## 2021-06-09 NOTE — Progress Notes (Signed)
[facility] ? ? ? ?SUBJECTIVE: Patient resting comfortably no chest pain does not want any medical attention and is threatening to sign out AMA ? ? ?Vitals:  ? 06/09/21 0423 06/09/21 0630 06/09/21 0347 06/09/21 4259  ?BP:  (!) 187/81 (!) 190/77 (!) 197/67  ?Pulse:    69  ?Resp:  (!) 23 19 19   ?Temp:    98.9 ?F (37.2 ?C)  ?TempSrc:    Oral  ?SpO2:    96%  ?Weight: 59.6 kg     ?Height:      ? ? ? ?Intake/Output Summary (Last 24 hours) at 06/09/2021 1000 ?Last data filed at 06/09/2021 0700 ?Gross per 24 hour  ?Intake 507.41 ml  ?Output 1050 ml  ?Net -542.59 ml  ? ? ? ? ?PHYSICAL EXAM ? ?General: Well developed, well nourished, in no acute distress ?HEENT:  Normocephalic and atramatic ?Neck:  No JVD.  ?Lungs: Clear bilaterally to auscultation and percussion. ?Heart: HRRR . Normal S1 and S2 without gallops or murmurs.  ?Abdomen: Bowel sounds are positive, abdomen soft and non-tender  ?Msk:  Back normal, normal gait. Normal strength and tone for age. ?Extremities: No clubbing, cyanosis or edema.   ?Neuro: Alert and oriented X 3. ?Psych:  Good affect, responds appropriately ? ? ?LABS: ?Basic Metabolic Panel: ?Recent Labs  ?  06/08/21 ?0312 06/09/21 ?0610  ?NA 138 138  ?K 3.5 3.5  ?CL 105 107  ?CO2 25 24  ?GLUCOSE 84 87  ?BUN 14 9  ?CREATININE 0.61 0.51  ?CALCIUM 8.7* 9.1  ?MG 2.0 1.8  ?PHOS 2.6 2.4*  ? ?Liver Function Tests: ?No results for input(s): AST, ALT, ALKPHOS, BILITOT, PROT, ALBUMIN in the last 72 hours. ?No results for input(s): LIPASE, AMYLASE in the last 72 hours. ?CBC: ?Recent Labs  ?  06/08/21 ?0312 06/09/21 ?0610  ?WBC 14.5* 10.2  ?HGB 11.8* 13.4  ?HCT 36.4 39.5  ?MCV 93.8 90.8  ?PLT 152 222  ? ?Cardiac Enzymes: ?No results for input(s): CKTOTAL, CKMB, CKMBINDEX, TROPONINI in the last 72 hours. ?BNP: ?Invalid input(s): POCBNP ?D-Dimer: ?No results for input(s): DDIMER in the last 72 hours. ?Hemoglobin A1C: ?Recent Labs  ?  06/07/21 ?0255  ?HGBA1C 5.4  ? ?Fasting Lipid Panel: ?Recent Labs  ?  06/07/21 ?0255   ?TRIG 54  ? ?Thyroid Function Tests: ?No results for input(s): TSH, T4TOTAL, T3FREE, THYROIDAB in the last 72 hours. ? ?Invalid input(s): FREET3 ?Anemia Panel: ?No results for input(s): VITAMINB12, FOLATE, FERRITIN, TIBC, IRON, RETICCTPCT in the last 72 hours. ? ?No results found. ? ? ?Echo preserved left ventricular function EF of 60% ? ?TELEMETRY: Normal sinus rhythm nonspecific T wave changes ? ?80: ? ?ASSESSMENT AND PLAN: ? ?Principal Problem: ?  Overdose of undetermined intent ?Active Problems: ?  CAD in native artery ?  Elevated troponin ?  UTI (urinary tract infection) ?  Acute metabolic encephalopathy ?  Essential hypertension ?  Hyperglycemia ?  Sepsis secondary to UTI [facility]) ?  ? ?Plan ?Respiratory failure patient now extubated continue conservative therapy ?Inhalers as necessary for significant lung disease ?Advised patient to refrain from tobacco as well as substance abuse ?Elevated troponin probably demand ischemia ?Metabolic acute encephalopathy improving ?Continue hypertension management and control ?Antibiotic therapy for possible sepsis ?Recommend psychiatric assessment evaluation ? ? ? ?Alwyn Pea, MD ?06/09/2021 ?10:00 AM ? ? ? ?  ?
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
## 2021-06-09 NOTE — Plan of Care (Signed)
?  Problem: Education: ?Goal: Knowledge of General Education information will improve ?Description: Including pain rating scale, medication(s)/side effects and non-pharmacologic comfort measures ?Outcome: Not Progressing ?  ?Problem: Health Behavior/Discharge Planning: ?Goal: Ability to manage health-related needs will improve ?Outcome: Not Progressing ?  ?Problem: Clinical Measurements: ?Goal: Ability to maintain clinical measurements within normal limits will improve ?Outcome: Progressing ?Goal: Will remain free from infection ?Outcome: Not Progressing ?Note: Patient is refusing IV insertion stating "it hurts and she is afraid" educated on need for IV antibiotics and still refused IV insertion. ?Goal: Diagnostic test results will improve ?Outcome: Progressing ?Goal: Respiratory complications will improve ?Outcome: Progressing ?Goal: Cardiovascular complication will be avoided ?Outcome: Progressing ?  ?Problem: Clinical Measurements: ?Goal: Will remain free from infection ?Outcome: Not Progressing ?Note: Patient is refusing IV insertion stating "it hurts and she is afraid" educated on need for IV antibiotics and still refused IV insertion. ?  ?Problem: Clinical Measurements: ?Goal: Diagnostic test results will improve ?Outcome: Progressing ?  ?Problem: Activity: ?Goal: Risk for activity intolerance will decrease ?Outcome: Progressing ?  ?Problem: Nutrition: ?Goal: Adequate nutrition will be maintained ?Outcome: Progressing ?  ?Problem: Elimination: ?Goal: Will not experience complications related to bowel motility ?Outcome: Progressing ?Goal: Will not experience complications related to urinary retention ?Outcome: Progressing ?  ?Problem: Pain Managment: ?Goal: General experience of comfort will improve ?Outcome: Progressing ?  ?Problem: Safety: ?Goal: Ability to remain free from injury will improve ?Outcome: Progressing ?  ?Problem: Skin Integrity: ?Goal: Risk for impaired skin integrity will decrease ?Outcome:  Progressing ?  ?Problem: Activity: ?Goal: Ability to tolerate increased activity will improve ?Outcome: Progressing ?  ?Problem: Respiratory: ?Goal: Ability to maintain a clear airway and adequate ventilation will improve ?Outcome: Progressing ?  ?Problem: Role Relationship: ?Goal: Method of communication will improve ?Outcome: Progressing ?  ?

## 2021-06-09 NOTE — Consult Note (Addendum)
PHARMACY CONSULT NOTE - FOLLOW UP ? ?Pharmacy Consult for Electrolyte Monitoring and Replacement  ? ?Recent Labs: ?Potassium (mmol/L)  ?Date Value  ?06/09/2021 3.5  ?12/25/2013 4.1  ? ?Magnesium (mg/dL)  ?Date Value  ?06/09/2021 1.8  ? ?Calcium (mg/dL)  ?Date Value  ?06/09/2021 9.1  ? ?Calcium, Total (mg/dL)  ?Date Value  ?12/25/2013 9.5  ? ?Albumin (g/dL)  ?Date Value  ?06/06/2021 3.8  ?12/25/2013 3.8  ? ?Phosphorus (mg/dL)  ?Date Value  ?06/09/2021 2.4 (L)  ? ?Sodium (mmol/L)  ?Date Value  ?06/09/2021 138  ?12/25/2013 141  ? ? ? ?Assessment: ?56 year old female w/ PMH of  RA, CAD, HTN, HDL presented to ED 06/06/21 via EMS with concerns for overdose after patient found hypoxic and unresponsive. Patient emergently intubated in ED and admitted to ICU. Pharmacy has been consulted to monitor and replace electrolytes as needed. Patient with prolonged Qtc of 541. ? ?Renal function consistent with baseline. Passed swallow evaluation. Diet thin liquids. ? ?Goal of Therapy:  ?Electrolytes within normal limits ?Mag > 2 ?K 4-5 ? ?Plan:  ?Phosphorus 2.4. Give Kphos PO 500 mg x 2. ?Magnesium 1.8. Give magnesium sulfate 2 grams x 1 ?Recheck electrolytes with AM labs  ? ?Jaynie Bream, PharmD ?Pharmacy Resident  ?06/09/2021 ?8:53 AM ? ?

## 2021-06-09 NOTE — Progress Notes (Addendum)
Patient refusing medications despite education of importance. Denton Lank, DO notified. ? ?Report given to [place], Charity fundraiser. Pt transported to room 207. ?

## 2021-06-09 NOTE — Evaluation (Signed)
Occupational Therapy Evaluation ?Patient Details ?Name: Nicole Ayala ?MRN: [id number] ?DOB: 23-Nov-1965 ?Today's Date: 06/09/2021 ? ? ?History of Present Illness 56 year old female w/ PMH of  RA, CAD, HTN, HDL presented to ED 06/06/21 via EMS with concerns for overdose after patient found hypoxic and unresponsive. Patient emergently intubated in ED and admitted to ICU. Pharmacy has been consulted to monitor and replace electrolytes as needed. Pt is now [employer].  ? ?Clinical Impression ?  ?Patient presenting with decreased Ind in self care, balance, functional mobility/transfers, endurance, and safety awareness. Patient reports living at home with boyfriend and son but they both work during the day per her report. She reports being independent with self care and IADLs. Throughout session pt keeping eyes closed when answering therapist questions. She is very self limiting and reports, " Francesca Oman are going to wish I wasn't here". Pt refusing all aspects of functional mobility this session. As soon as therapist exits glass doors pt sits up in bed and gets to EOB without physical assistance. Patient will benefit from acute OT to increase overall independence in the areas of ADLs, functional mobility, and safety awareness in order to safely discharge home.  ?   ? ?Recommendations for follow up therapy are one component of a multi-disciplinary discharge planning process, led by the attending physician.  Recommendations may be updated based on patient status, additional functional criteria and insurance authorization.  ? ?Follow Up Recommendations ? No OT follow up  ?  ?Assistance Recommended at Discharge PRN  ?Patient can return home with the following A little help with walking and/or transfers;A little help with bathing/dressing/bathroom;Help with stairs or ramp for entrance;Assist for transportation;Assistance with cooking/housework ? ?  ?Functional Status Assessment ? Patient has had a recent decline in their functional status  and demonstrates the ability to make significant improvements in function in a reasonable and predictable amount of time.  ?Equipment Recommendations ? None recommended by OT  ?  ?   ?Precautions / Restrictions Precautions ?Precautions: Fall ?Restrictions ?Weight Bearing Restrictions: No  ? ?  ? ?Mobility Bed Mobility ?Overal bed mobility: Modified Independent ?  ?  ?  ?  ?  ?  ?  ?  ? ?Transfers ?  ?  ?  ?  ?  ?  ?  ?  ?  ?General transfer comment: Pt refuses ?  ? ?  ?Balance Overall balance assessment: Needs assistance ?Sitting-balance support: Feet supported ?Sitting balance-Leahy Scale: Good ?  ?  ?  ?  ?  ?  ?  ?  ?  ?  ?  ?  ?  ?  ?  ?  ?   ? ?ADL either performed or assessed with clinical judgement  ? ?ADL   ?  ?  ?  ?  ?  ?  ?  ?  ?  ?  ?  ?  ?  ?  ?  ?  ?  ?  ?  ?General ADL Comments: Pt refuses to demonstrate self care tasks and functional transfer this session.  ? ? ? ?Vision Patient Visual Report: No change from baseline ?   ?   ?   ?   ? ?Pertinent Vitals/Pain Pain Assessment ?Pain Assessment: No/denies pain  ? ? ? ?   ?Extremity/Trunk Assessment Upper Extremity Assessment ?Upper Extremity Assessment: Overall WFL for tasks assessed;Generalized weakness ?  ?Lower Extremity Assessment ?Lower Extremity Assessment: Overall WFL for tasks assessed;Generalized weakness ?  ?  ?  ?Communication  Communication ?Communication: No difficulties ?  ?Cognition Arousal/Alertness: Awake/alert ?Behavior During Therapy: [facility] for tasks assessed/performed ?Overall Cognitive Status: Within Functional Limits for tasks assessed ?  ?  ?  ?  ?  ?  ?  ?  ?  ?  ?  ?  ?  ?  ?  ?  ?General Comments: Pt attempting to act like she is asleep when asked questions and reports, "yall going to wish I wasn't here" ?  ?  ?   ?   ?   ? ? ?Home Living Family/patient expects to be discharged to:: Private residence ?Living Arrangements: Spouse/significant other;Children (son and boyfriend) ?Available Help at Discharge: Family;Available  PRN/intermittently ?Type of Home: Mobile home ?Home Access: Level entry ?  ?  ?Home Layout: One level ?  ?  ?Bathroom Shower/Tub: Tub/shower unit ?  ?  ?  ?  ?Home Equipment: None ?  ?  ?  ? ?  ?Prior Functioning/Environment Prior Level of Function : Independent/Modified Independent ?  ?  ?  ?  ?  ?  ?  ?  ?  ? ?  ?  ?OT Problem List: Decreased strength;Decreased activity tolerance;Impaired balance (sitting and/or standing);Decreased safety awareness ?  ?   ?OT Treatment/Interventions: Self-care/ADL training;Therapeutic exercise;Therapeutic activities;Energy conservation;DME and/or AE instruction;Patient/family education;Manual therapy;Balance training  ?  ?OT Goals(Current goals can be found in the care plan section) Acute Rehab OT Goals ?Patient Stated Goal: to go home ?OT Goal Formulation: With patient/family ?Time For Goal Achievement: 06/23/21 ?Potential to Achieve Goals: Fair ?ADL Goals ?Pt Will Perform Grooming: Independently ?Pt Will Perform Lower Body Dressing: Independently ?Pt Will Transfer to Toilet: Independently ?Pt Will Perform Toileting - Clothing Manipulation and hygiene: Independently  ?OT Frequency: Min 2X/week ?  ? ?   ?AM-PAC OT "6 Clicks" Daily Activity     ?Outcome Measure Help from another person eating meals?: None ?Help from another person taking care of personal grooming?: None ?Help from another person toileting, which includes using toliet, bedpan, or urinal?: A Little ?Help from another person bathing (including washing, rinsing, drying)?: A Little ?Help from another person to put on and taking off regular upper body clothing?: None ?Help from another person to put on and taking off regular lower body clothing?: A Little ?6 Click Score: 21 ?  ?End of Session Nurse Communication: Mobility status ? ?Activity Tolerance: Other (comment) (self limiting) ?Patient left: in bed;with call bell/phone within reach;with bed alarm set ? ?OT Visit Diagnosis: Unsteadiness on feet (R26.81);Repeated  falls (R29.6);Muscle weakness (generalized) (M62.81)  ?              ?Time: [id number] ?OT Time Calculation (min): 11 min ?Charges:  OT General Charges ?$OT Visit: 1 Visit ?OT Evaluation ?$OT Eval Low Complexity: 1 Low ? ?Jackquline DenmarkKatie Kaley Jutras, MS, OTR/L , CBIS ?ascom [phone]  ?06/09/21, 2:15 PM  ?

## 2021-06-09 NOTE — TOC Benefit Eligibility Note (Signed)
Patient Advocate Encounter  Insurance verification completed.    The patient is currently admitted and upon discharge could be taking Brilinta 90 mg.  The current 30 day co-pay is, $4.00.   The patient is insured through [employer]     Reinhold Rickey, CPhT Pharmacy Patient Advocate Specialist [facility] Patient Advocate Team Direct Number: [phone]  Fax: [phone]        
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
## 2021-06-10 DIAGNOSIS — I160 Hypertensive urgency: Secondary | ICD-10-CM | POA: Diagnosis not present

## 2021-06-10 DIAGNOSIS — T50904A Poisoning by unspecified drugs, medicaments and biological substances, undetermined, initial encounter: Secondary | ICD-10-CM | POA: Diagnosis not present

## 2021-06-10 DIAGNOSIS — I2510 Atherosclerotic heart disease of native coronary artery without angina pectoris: Secondary | ICD-10-CM | POA: Diagnosis not present

## 2021-06-10 DIAGNOSIS — N179 Acute kidney failure, unspecified: Secondary | ICD-10-CM | POA: Diagnosis present

## 2021-06-10 DIAGNOSIS — I10 Essential (primary) hypertension: Secondary | ICD-10-CM | POA: Diagnosis not present

## 2021-06-10 LAB — BASIC METABOLIC PANEL
Anion gap: 11 (ref 5–15)
BUN: 19 mg/dL (ref 6–20)
CO2: 26 mmol/L (ref 22–32)
Calcium: 9.8 mg/dL (ref 8.9–10.3)
Chloride: 103 mmol/L (ref 98–111)
Creatinine, Ser: 0.54 mg/dL (ref 0.44–1.00)
GFR, Estimated: >60 mL/min (ref >60)
Glucose, Bld: 107 mg/dL — ABNORMAL HIGH (ref 70–99)
Potassium: 3.5 mmol/L (ref 3.5–5.1)
Sodium: 140 mmol/L (ref 135–145)

## 2021-06-10 LAB — MAGNESIUM: Magnesium: 2 mg/dL (ref 1.7–2.4)

## 2021-06-10 LAB — PHOSPHORUS: Phosphorus: 4.5 mg/dL (ref 2.5–4.6)

## 2021-06-10 LAB — GLUCOSE, CAPILLARY
Glucose-Capillary: 107 mg/dL — ABNORMAL HIGH (ref 70–99)
Glucose-Capillary: 97 mg/dL (ref 70–99)
Glucose-Capillary: 173 mg/dL — ABNORMAL HIGH (ref 70–99)

## 2021-06-10 MED ORDER — LISINOPRIL-HYDROCHLOROTHIAZIDE 20-12.5 MG PO TABS: 2.0000 | ORAL_TABLET | Freq: Every day | ORAL | 3 refills | Status: AC

## 2021-06-10 MED ORDER — AMLODIPINE BESYLATE 10 MG PO TABS: 10.0000 mg | ORAL_TABLET | Freq: Every day | ORAL | 3 refills | Status: AC

## 2021-06-10 MED ORDER — TICAGRELOR 90 MG PO TABS: 90.0000 mg | ORAL_TABLET | Freq: Two times a day (BID) | ORAL | 1 refills | Status: AC

## 2021-06-10 MED ORDER — ADULT MULTIVITAMIN W/MINERALS CH: 1.0000 | ORAL_TABLET | Freq: Every day | ORAL | Status: AC

## 2021-06-10 MED ORDER — POTASSIUM CHLORIDE CRYS ER 20 MEQ PO TBCR
40.0000 meq | EXTENDED_RELEASE_TABLET | Freq: Once | ORAL | Status: AC
  Administered 2021-06-10: 40 meq via ORAL
  Filled 2021-06-10: qty 2

## 2021-06-10 MED ORDER — ENSURE ENLIVE PO LIQD: 237.0000 mL | Freq: Two times a day (BID) | ORAL | 12 refills | Status: AC

## 2021-06-10 NOTE — Progress Notes (Signed)
Mobility Specialist - Progress Note ? ? 06/10/21 1258  ?Mobility  ?Activity Ambulated with assistance in room;Stood at bedside;Dangled on edge of bed;Transferred from bed to chair  ?Level of Assistance Standby assist, set-up cues, supervision of patient - no hands on  ?Assistive Device None  ?Distance Ambulated (ft) 30 ft  ?Activity Response Tolerated well  ?$Mobility charge 1 Mobility  ? ? ? ?Mobility responded to bed alarm. On arrival, pt dangling EOB attempting to don UB/LB clothing. Assist needed to don shirt, no assist needed for LB dressing. Unsteady, especially with retro-stepping. Pt ambulated to sink for ADLs. Assist only needed to comb hair d/t tangles. Pt began crying, "I'm tired of this life" active listening and encouragement provided. Pt ambulated to recliner where she was left with alarm set, needs in reach. RN notified.  ? ? ?Filiberto Pinks ?Mobility Specialist ?06/10/21, 1:38 PM ? ? ? ? ?

## 2021-06-10 NOTE — Progress Notes (Signed)
Patient decided to leave after MD let her kinow she would be discharge and was unwilling to wait for discharge paperwork.  Patient ambulated to lobby with family member . ?

## 2021-06-10 NOTE — Progress Notes (Signed)
[facility] Cardiology ? ? ? ?SUBJECTIVE: Patient refuses further evaluation has asked to be discharged AGAINST MEDICAL ADVICE.  Denies any chest pain or shortness of breath no palpitations or tachycardia ? ? ?Vitals:  ? 06/09/21 1714 06/09/21 2003 06/10/21 0518 06/10/21 0817  ?BP: 132/75 136/79 121/74 (!) 142/95  ?Pulse: 71 74 65 72  ?Resp: 18 16 18 20   ?Temp: 98.5 ?F (36.9 ?C) 98.1 ?F (36.7 ?C) 98.6 ?F (37 ?C) 98.4 ?F (36.9 ?C)  ?TempSrc:      ?SpO2: 97% 98% 99% 95%  ?Weight:   56.5 kg   ?Height:      ? ? ? ?Intake/Output Summary (Last 24 hours) at 06/10/2021 1142 ?Last data filed at 06/09/2021 2040 ?Gross per 24 hour  ?Intake 174.43 ml  ?Output 0 ml  ?Net 174.43 ml  ? ? ? ? ?PHYSICAL EXAM ? ?General: Well developed, well nourished, in no acute distress ?HEENT:  Normocephalic and atramatic ?Neck:  No JVD.  ?Lungs: Clear bilaterally to auscultation and percussion. ?Heart: HRRR . Normal S1 and S2 without gallops or murmurs.  ?Abdomen: Bowel sounds are positive, abdomen soft and non-tender  ?Msk:  Back normal, normal gait. Normal strength and tone for age. ?Extremities: No clubbing, cyanosis or edema.   ?Neuro: Alert and oriented X 3. ?Psych:  Good affect, responds appropriately ? ? ?LABS: ?Basic Metabolic Panel: ?Recent Labs  ?  06/09/21 ?0610 06/10/21 ?0438  ?NA 138 140  ?K 3.5 3.5  ?CL 107 103  ?CO2 24 26  ?GLUCOSE 87 107*  ?BUN 9 19  ?CREATININE 0.51 0.54  ?CALCIUM 9.1 9.8  ?MG 1.8 2.0  ?PHOS 2.4* 4.5  ? ?Liver Function Tests: ?No results for input(s): AST, ALT, ALKPHOS, BILITOT, PROT, ALBUMIN in the last 72 hours. ?No results for input(s): LIPASE, AMYLASE in the last 72 hours. ?CBC: ?Recent Labs  ?  06/08/21 ?0312 06/09/21 ?0610  ?WBC 14.5* 10.2  ?HGB 11.8* 13.4  ?HCT 36.4 39.5  ?MCV 93.8 90.8  ?PLT 152 222  ? ?Cardiac Enzymes: ?No results for input(s): CKTOTAL, CKMB, CKMBINDEX, TROPONINI in the last 72 hours. ?BNP: ?Invalid input(s): POCBNP ?D-Dimer: ?No results for input(s): DDIMER in the last 72 hours. ?Hemoglobin  A1C: ?No results for input(s): HGBA1C in the last 72 hours. ?Fasting Lipid Panel: ?No results for input(s): CHOL, HDL, LDLCALC, TRIG, CHOLHDL, LDLDIRECT in the last 72 hours. ?Thyroid Function Tests: ?No results for input(s): TSH, T4TOTAL, T3FREE, THYROIDAB in the last 72 hours. ? ?Invalid input(s): FREET3 ?Anemia Panel: ?No results for input(s): VITAMINB12, FOLATE, FERRITIN, TIBC, IRON, RETICCTPCT in the last 72 hours. ? ?No results found. ? ? ? ? ?TELEMETRY: Normal sinus rhythm rate of 75: ? ?ASSESSMENT AND PLAN: ? ?Principal Problem: ?  Overdose of undetermined intent ?Active Problems: ?  CAD in native artery ?  Elevated troponin ?  UTI (urinary tract infection) ?  Acute metabolic encephalopathy ?  Essential hypertension ?  Hyperglycemia ?  Sepsis secondary to UTI [facility]) ?  Hypertensive urgency ?  ? ?Plan ?Troponins demand ischemia improved no significant cardiac component ?Continue medical therapy ?Advised patient refrain from tobacco abuse ?Advised patient to refrain from substance abuse ?Agree with antibiotic therapy for infection and sepsis ?If the patient follow-up with cardiology as an outpatient ? ? ?Alwyn Pea, MD ?06/10/2021 ?11:42 AM ? ? ? ?  ?

## 2021-06-10 NOTE — Progress Notes (Signed)
PHARMACY CONSULT NOTE - FOLLOW UP ? ?Pharmacy Consult for Electrolyte Monitoring and Replacement  ? ?Recent Labs: ?Potassium (mmol/L)  ?Date Value  ?06/10/2021 3.5  ?12/25/2013 4.1  ? ?Magnesium (mg/dL)  ?Date Value  ?06/10/2021 2.0  ? ?Calcium (mg/dL)  ?Date Value  ?06/10/2021 9.8  ? ?Calcium, Total (mg/dL)  ?Date Value  ?12/25/2013 9.5  ? ?Albumin (g/dL)  ?Date Value  ?06/06/2021 3.8  ?12/25/2013 3.8  ? ?Phosphorus (mg/dL)  ?Date Value  ?06/10/2021 4.5  ? ?Sodium (mmol/L)  ?Date Value  ?06/10/2021 140  ?12/25/2013 141  ? ? ?Assessment: ?56 year old female w/ PMH of  RA, CAD, HTN, HDL presented to ED 06/06/21 via EMS with concerns for overdose after patient found hypoxic and unresponsive. Patient emergently intubated in ED and admitted to ICU. Pharmacy has been consulted to monitor and replace electrolytes as needed. Patient with prolonged Qtc of 541. ? ?Renal function consistent with baseline. Passed swallow evaluation. Diet thin liquids. ? ?Goal of Therapy:  ?Electrolytes within normal limits ?Mag > 2 ?K 4-5 ? ?Plan:  ?Mg = 2, and Pho=4.5 are within desired goal range. Noted patient transferred form ICU to medical floor and refusing ALL IV medication.  ?K = 3.5 improved but remains below desired goal for 4-5. Will replace with 17meQ Kcl oral x 1 dose ?Will discontinue CCM electrolytes management for patient out of ICU and check with primary MD if further electrolytes management by pharmacy needed. ? ?Kemaya Dorner Rodriguez-Guzman PharmD, BCPS ?06/10/2021 7:36 AM ? ? ?

## 2021-06-10 NOTE — Assessment & Plan Note (Signed)
Cr 1.05 on admission, up from baseline of about 0.5-0.6.  Renal function improved with IV hydration. Likely prerenal azotemia in setting of acute illness, transient hypotension, dehydration. ?
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
## 2021-06-10 NOTE — Plan of Care (Signed)
Patient ambulated to front entrance with family member after MD notified patient she would be discharged.  She was unwilling to wait for paperwork.  No PIV on patient. IVC order rescinded. ?

## 2021-06-11 LAB — CULTURE, BLOOD (ROUTINE X 2)
Culture: NO GROWTH
Culture: NO GROWTH
Special Requests: ADEQUATE

## 2021-07-21 DEATH — deceased
